# Patient Record
Sex: Female | Born: 1985 | Race: Black or African American | Hispanic: No | Marital: Single | Smoking: Former smoker
Health system: Southern US, Community
[De-identification: ages and names within clinical notes are randomized; demographics above are authoritative.]

## PROBLEM LIST (undated history)

## (undated) DIAGNOSIS — K219 Gastro-esophageal reflux disease without esophagitis: Secondary | ICD-10-CM

## (undated) DIAGNOSIS — I10 Essential (primary) hypertension: Secondary | ICD-10-CM

## (undated) DIAGNOSIS — C719 Malignant neoplasm of brain, unspecified: Secondary | ICD-10-CM

## (undated) DIAGNOSIS — K589 Irritable bowel syndrome without diarrhea: Secondary | ICD-10-CM

## (undated) DIAGNOSIS — C50919 Malignant neoplasm of unspecified site of unspecified female breast: Secondary | ICD-10-CM

## (undated) HISTORY — PX: MASTECTOMY: SHX3

## (undated) HISTORY — DX: Malignant neoplasm of brain, unspecified: C71.9

---
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
## 2007-01-01 ENCOUNTER — Other Ambulatory Visit: Admission: RE | Admit: 2007-01-01 | Discharge: 2007-01-01 | Payer: Self-pay | Admitting: Unknown Physician Specialty

## 2007-01-01 ENCOUNTER — Encounter (INDEPENDENT_AMBULATORY_CARE_PROVIDER_SITE_OTHER): Payer: Self-pay | Admitting: Unknown Physician Specialty

## 2008-03-05 ENCOUNTER — Emergency Department (HOSPITAL_COMMUNITY): Admission: EM | Admit: 2008-03-05 | Discharge: 2008-03-06 | Payer: Self-pay | Admitting: Emergency Medicine

## 2010-10-14 NOTE — Op Note (Signed)
April Moreno, April Moreno NO.:  [id number]   MEDICAL RECORD NO.:  [id number]          PATIENT TYPE:  OIB   LOCATION:  2899                         FACILITY:  [facility]   PHYSICIAN:  Jon Gills, M.D.  DATE OF BIRTH:  11/04/1985   DATE OF PROCEDURE:  06/17/2004  DATE OF DISCHARGE:  06/17/2004                                 OPERATIVE REPORT   PREOPERATIVE DIAGNOSES:  1.  Abdominal pain.  2.  Dark stools.   POSTOPERATIVE DIAGNOSES:  1.  Abdominal pain.  2.  Dark stools.   SURGEON:  Jon Gills, M.D.   ASSISTANT:  None.   DESCRIPTION OF FINDINGS:  Following informed written consent, the patient  was taken to the recovery room and placed under general anesthesia with  continuous cardiopulmonary monitoring.  She remained in the supine position  and the Olympus endoscope was inserted per mouth.  The esophagus, stomach  and duodenum were visualized.  No definite abnormalities were seen.  Specifically, there was no visual evidence for esophagitis, gastritis,  duodenitis or peptic ulcer disease.  A solitary gastric biopsy was obtained  and was negative for Helicobacter.  Several esophageal and duodenal biopsies  were histologically unremarkable.  [name] tolerated the procedure well and  was awakened and transferred to the recovery room in satisfactory condition.  She will be released later today to the care of her grandmother.   DESCRIPTION OF TECHNICAL PROCEDURES USED:  The Olympus GIA-140 endoscope  with cold biopsy forceps.   DESCRIPTION OF SPECIMENS REMOVED:  1.  Duodenum x 3.  2.  Gastric x 1.  3.  Esophagus x 2.      JHC/MEDQ  D:  06/23/2004  T:  06/23/2004  Job:  [id number]   cc:   Donna Bernard, M.D.  [address]  [place]  [place] [address]  Fax: [phone]

## 2011-02-27 LAB — URINALYSIS, ROUTINE W REFLEX MICROSCOPIC
Bilirubin Urine: NEGATIVE
Glucose, UA: NEGATIVE
Hgb urine dipstick: NEGATIVE
Specific Gravity, Urine: 1.032 — ABNORMAL HIGH
Urobilinogen, UA: 0.2

## 2011-02-27 LAB — RAPID URINE DRUG SCREEN, HOSP PERFORMED
Amphetamines: NOT DETECTED
Benzodiazepines: NOT DETECTED
Opiates: NOT DETECTED
Tetrahydrocannabinol: NOT DETECTED

## 2011-02-27 LAB — DIFFERENTIAL
Basophils Absolute: 0
Basophils Relative: 0
Lymphocytes Relative: 46
Monocytes Absolute: 0.4
Neutro Abs: 3.4
Neutrophils Relative %: 48

## 2011-02-27 LAB — POCT I-STAT, CHEM 8
BUN: 8
Chloride: 105
Creatinine, Ser: 0.8
Glucose, Bld: 78
Potassium: 3.7
Sodium: 139

## 2011-02-27 LAB — CBC
Hemoglobin: 13.4
Platelets: 316
RDW: 12.7

## 2011-02-27 LAB — HEPATIC FUNCTION PANEL
AST: 20
Albumin: 4
Total Protein: 7.6

## 2011-02-27 LAB — ETHANOL: Alcohol, Ethyl (B): <5

## 2013-05-22 ENCOUNTER — Encounter (HOSPITAL_COMMUNITY): Payer: Self-pay | Admitting: Emergency Medicine

## 2013-05-22 ENCOUNTER — Emergency Department (HOSPITAL_COMMUNITY)
Admission: EM | Admit: 2013-05-22 | Discharge: 2013-05-22 | Disposition: A | Discharge status: Home Health | Payer: BC Managed Care – PPO | Attending: Emergency Medicine | Admitting: Emergency Medicine

## 2013-05-22 DIAGNOSIS — R112 Nausea with vomiting, unspecified: Secondary | ICD-10-CM

## 2013-05-22 DIAGNOSIS — Z87440 Personal history of urinary (tract) infections: Secondary | ICD-10-CM | POA: Insufficient documentation

## 2013-05-22 DIAGNOSIS — Z79899 Other long term (current) drug therapy: Secondary | ICD-10-CM | POA: Insufficient documentation

## 2013-05-22 DIAGNOSIS — Z87442 Personal history of urinary calculi: Secondary | ICD-10-CM | POA: Insufficient documentation

## 2013-05-22 DIAGNOSIS — R109 Unspecified abdominal pain: Secondary | ICD-10-CM

## 2013-05-22 DIAGNOSIS — R1084 Generalized abdominal pain: Secondary | ICD-10-CM | POA: Insufficient documentation

## 2013-05-22 DIAGNOSIS — Z91040 Latex allergy status: Secondary | ICD-10-CM | POA: Insufficient documentation

## 2013-05-22 DIAGNOSIS — Z3202 Encounter for pregnancy test, result negative: Secondary | ICD-10-CM | POA: Insufficient documentation

## 2013-05-22 DIAGNOSIS — K589 Irritable bowel syndrome without diarrhea: Secondary | ICD-10-CM | POA: Insufficient documentation

## 2013-05-22 DIAGNOSIS — Z881 Allergy status to other antibiotic agents status: Secondary | ICD-10-CM | POA: Insufficient documentation

## 2013-05-22 DIAGNOSIS — I10 Essential (primary) hypertension: Secondary | ICD-10-CM | POA: Insufficient documentation

## 2013-05-22 DIAGNOSIS — K219 Gastro-esophageal reflux disease without esophagitis: Secondary | ICD-10-CM | POA: Insufficient documentation

## 2013-05-22 DIAGNOSIS — F17200 Nicotine dependence, unspecified, uncomplicated: Secondary | ICD-10-CM | POA: Insufficient documentation

## 2013-05-22 HISTORY — DX: Gastro-esophageal reflux disease without esophagitis: K21.9

## 2013-05-22 HISTORY — DX: Irritable bowel syndrome without diarrhea: K58.9

## 2013-05-22 HISTORY — DX: Essential (primary) hypertension: I10

## 2013-05-22 LAB — URINALYSIS, ROUTINE W REFLEX MICROSCOPIC
Bilirubin Urine: NEGATIVE
Nitrite: NEGATIVE
Specific Gravity, Urine: 1.018 (ref 1.005–1.030)
Urobilinogen, UA: 1 mg/dL (ref 0.0–1.0)
pH: 6.5 (ref 5.0–8.0)

## 2013-05-22 LAB — URINE MICROSCOPIC-ADD ON

## 2013-05-22 LAB — CBC WITH DIFFERENTIAL/PLATELET
Basophils Absolute: 0 10*3/uL (ref 0.0–0.1)
Eosinophils Relative: 0 % (ref 0–5)
HCT: 35.1 % — ABNORMAL LOW (ref 36.0–46.0)
Lymphocytes Relative: 18 % (ref 12–46)
Lymphs Abs: 2.1 10*3/uL (ref 0.7–4.0)
MCV: 87.1 fL (ref 78.0–100.0)
Neutro Abs: 8.3 10*3/uL — ABNORMAL HIGH (ref 1.7–7.7)
Platelets: 300 10*3/uL (ref 150–400)
RBC: 4.03 MIL/uL (ref 3.87–5.11)
RDW: 12 % (ref 11.5–15.5)
WBC: 11.6 10*3/uL — ABNORMAL HIGH (ref 4.0–10.5)

## 2013-05-22 LAB — BASIC METABOLIC PANEL
CO2: 24 mEq/L (ref 19–32)
Chloride: 97 mEq/L (ref 96–112)
Glucose, Bld: 126 mg/dL — ABNORMAL HIGH (ref 70–99)
Sodium: 135 mEq/L (ref 135–145)

## 2013-05-22 LAB — PREGNANCY, URINE: Preg Test, Ur: NEGATIVE

## 2013-05-22 MED ORDER — LORAZEPAM 2 MG/ML IJ SOLN
0.5000 mg | Freq: Once | INTRAMUSCULAR | Status: AC
  Administered 2013-05-22: 0.5 mg via INTRAVENOUS
  Filled 2013-05-22: qty 1

## 2013-05-22 MED ORDER — PROMETHAZINE HCL 25 MG PO TABS: 25.0000 mg | ORAL_TABLET | Freq: Four times a day (QID) | ORAL | Status: DC | PRN

## 2013-05-22 MED ORDER — DICYCLOMINE HCL 10 MG/ML IM SOLN
20.0000 mg | Freq: Once | INTRAMUSCULAR | Status: AC
  Administered 2013-05-22: 20 mg via INTRAMUSCULAR
  Filled 2013-05-22: qty 2

## 2013-05-22 MED ORDER — PROMETHAZINE HCL 25 MG/ML IJ SOLN
12.5000 mg | INTRAMUSCULAR | Status: AC
  Administered 2013-05-22: 12.5 mg via INTRAVENOUS
  Filled 2013-05-22: qty 1

## 2013-05-22 MED ORDER — HYDROMORPHONE HCL PF 1 MG/ML IJ SOLN
0.5000 mg | Freq: Once | INTRAMUSCULAR | Status: AC
  Administered 2013-05-22: 0.5 mg via INTRAVENOUS
  Filled 2013-05-22: qty 1

## 2013-05-22 MED ORDER — PROMETHAZINE HCL 25 MG RE SUPP: 25.0000 mg | Freq: Four times a day (QID) | RECTAL | Status: DC | PRN

## 2013-05-22 MED ORDER — HYDROCODONE-ACETAMINOPHEN 5-325 MG PO TABS: 2.0000 | ORAL_TABLET | ORAL | Status: DC | PRN

## 2013-05-22 NOTE — ED Notes (Signed)
Pt reports right flank pain, was seen in [place], [place] on Monday and told she has kidney stone and UTI. [facility] in [facility] because "the medicine wasn't working." Pt arrived here in [place] last night at 2000 and reports 'I keep vomiting and cant keep the pain medicine down." Denies blood in urine.

## 2013-05-22 NOTE — ED Notes (Signed)
Dr Otter at bedside  

## 2013-05-22 NOTE — ED Provider Notes (Signed)
CSN: [id number]     Arrival date & time 05/22/13  4098 History   First MD Initiated Contact with Patient 05/22/13 [phone]     Chief Complaint  Patient presents with  . Flank Pain   (Consider location/radiation/quality/duration/timing/severity/associated sxs/prior Treatment) HPI 27 yo female presents to the ER with complaint of diffuse abdominal pain, n/v.  Pt was dx with kidney stone on Monday in [place], also with UTI.  Pt has been unable to keep down medications.  No fevers, diarrhea.  No prior h/o kidney stones.  Pt with h/o IBS, has been unable to keep down her bentyl.   Past Medical History  Diagnosis Date  . IBS (irritable bowel syndrome)   . GERD (gastroesophageal reflux disease)   . Hypertension    History reviewed. No pertinent past surgical history. No family history on file. History  Substance Use Topics  . Smoking status: Current Every Day Smoker  . Smokeless tobacco: Not on file  . Alcohol Use: Yes   OB History   Grav Para Term Preterm Abortions TAB SAB Ect Mult Living                 Review of Systems  All other systems reviewed and are negative.    Allergies  Amoxicillin and Latex  Home Medications   Current Outpatient Rx  Name  Route  Sig  Dispense  Refill  . dicyclomine (BENTYL) 10 MG capsule   Oral   Take 10 mg by mouth 4 (four) times daily.         . ondansetron (ZOFRAN-ODT) 4 MG disintegrating tablet   Oral   Take 4 mg by mouth every 8 (eight) hours as needed for nausea or vomiting.         . sulfamethoxazole-trimethoprim (BACTRIM DS,SEPTRA DS) 800-160 MG per tablet   Oral   Take 1 tablet by mouth 2 (two) times daily.         . traMADol (ULTRAM) 50 MG tablet   Oral   Take 50 mg by mouth every 12 (twelve) hours as needed.         Marland Kitchen HYDROcodone-acetaminophen (NORCO/VICODIN) 5-325 MG per tablet   Oral   Take 2 tablets by mouth every 4 (four) hours as needed.   20 tablet   0   . promethazine (PHENERGAN) 25 MG suppository   Rectal  Place 1 suppository (25 mg total) rectally every 6 (six) hours as needed for nausea or vomiting.   12 each   0   . promethazine (PHENERGAN) 25 MG tablet   Oral   Take 1 tablet (25 mg total) by mouth every 6 (six) hours as needed for nausea.   30 tablet   0    BP 118/84  Pulse 71  Temp(Src) 99.4 F (37.4 C)  Resp 18  SpO2 98%  LMP 04/12/2013 Physical Exam  Constitutional: She is oriented to person, place, and time. She appears well-developed and well-nourished. She appears distressed (uncomfortable appearing).  HENT:  Head: Normocephalic and atraumatic.  Nose: Nose normal.  Mouth/Throat: Oropharynx is clear and moist.  Eyes: Conjunctivae and EOM are normal. Pupils are equal, round, and reactive to light.  Neck: Normal range of motion. Neck supple. No JVD present. No tracheal deviation present. No thyromegaly present.  Cardiovascular: Normal rate, regular rhythm, normal heart sounds and intact distal pulses.  Exam reveals no gallop and no friction rub.   No murmur heard. Pulmonary/Chest: Effort normal and breath sounds normal. No stridor. No respiratory  distress. She has no wheezes. She has no rales. She exhibits no tenderness.  Abdominal: Soft. Bowel sounds are normal. She exhibits no distension and no mass. There is no tenderness. There is no rebound and no guarding.  No cva tenderness, diffuse abd pain to palpation  Musculoskeletal: Normal range of motion. She exhibits no edema and no tenderness.  Lymphadenopathy:    She has no cervical adenopathy.  Neurological: She is oriented to person, place, and time. She has normal reflexes. No cranial nerve deficit. She exhibits normal muscle tone. Coordination normal.  Skin: Skin is dry. No rash noted. No erythema. No pallor.  Psychiatric: She has a normal mood and affect. Her behavior is normal. Judgment and thought content normal.    ED Course  Procedures (including critical care time) Labs Review Labs Reviewed  URINALYSIS,  ROUTINE W REFLEX MICROSCOPIC - Abnormal; Notable for the following:    APPearance CLOUDY (*)    Hgb urine dipstick SMALL (*)    Ketones, ur 15 (*)    Protein, ur 30 (*)    Leukocytes, UA TRACE (*)    All other components within normal limits  CBC WITH DIFFERENTIAL - Abnormal; Notable for the following:    WBC 11.6 (*)    HCT 35.1 (*)    Neutro Abs 8.3 (*)    Monocytes Absolute 1.2 (*)    All other components within normal limits  BASIC METABOLIC PANEL - Abnormal; Notable for the following:    Potassium 3.3 (*)    Glucose, Bld 126 (*)    GFR calc non Af Amer 71 (*)    GFR calc Af Amer 83 (*)    All other components within normal limits  URINE MICROSCOPIC-ADD ON - Abnormal; Notable for the following:    Squamous Epithelial / LPF MANY (*)    Bacteria, UA MANY (*)    All other components within normal limits  URINE CULTURE  PREGNANCY, URINE   Imaging Review No results found.    MDM   1. Abdominal pain   2. Nausea and vomiting    27 yo female with reported kidney stone, UTI, now with persistent n/v.  Labs here unremarkable.  Urine sent for culture.  Pt much improved with ivf, pain/nausea medications.  Records received from outside hospital, 4 mm stone mid right ureter.  Urine dip shows nitrites, le.  No micro of urine recorded.  Pt encouraged to start back on abx.  Strict return precautions given.    Olivia Mackie, MD 05/22/13 [phone]

## 2013-05-23 LAB — URINE CULTURE: Colony Count: 70000

## 2021-02-25 ENCOUNTER — Observation Stay (HOSPITAL_COMMUNITY): Payer: Commercial Managed Care - PPO

## 2021-02-25 ENCOUNTER — Encounter (HOSPITAL_COMMUNITY): Payer: Self-pay

## 2021-02-25 ENCOUNTER — Inpatient Hospital Stay (HOSPITAL_COMMUNITY)
Admission: EM | Admit: 2021-02-25 | Discharge: 2021-02-27 | DRG: 641 | Disposition: A | Discharge status: Home / Self Care | Payer: Commercial Managed Care - PPO | Attending: Family Medicine | Admitting: Family Medicine

## 2021-02-25 ENCOUNTER — Emergency Department (HOSPITAL_COMMUNITY): Payer: Commercial Managed Care - PPO

## 2021-02-25 DIAGNOSIS — K3184 Gastroparesis: Secondary | ICD-10-CM | POA: Diagnosis present

## 2021-02-25 DIAGNOSIS — Z8249 Family history of ischemic heart disease and other diseases of the circulatory system: Secondary | ICD-10-CM

## 2021-02-25 DIAGNOSIS — E860 Dehydration: Secondary | ICD-10-CM | POA: Diagnosis not present

## 2021-02-25 DIAGNOSIS — G893 Neoplasm related pain (acute) (chronic): Secondary | ICD-10-CM | POA: Diagnosis present

## 2021-02-25 DIAGNOSIS — Z79891 Long term (current) use of opiate analgesic: Secondary | ICD-10-CM

## 2021-02-25 DIAGNOSIS — Z20822 Contact with and (suspected) exposure to covid-19: Secondary | ICD-10-CM | POA: Diagnosis present

## 2021-02-25 DIAGNOSIS — Z79899 Other long term (current) drug therapy: Secondary | ICD-10-CM

## 2021-02-25 DIAGNOSIS — E872 Acidosis, unspecified: Secondary | ICD-10-CM | POA: Diagnosis present

## 2021-02-25 DIAGNOSIS — R531 Weakness: Secondary | ICD-10-CM

## 2021-02-25 DIAGNOSIS — R000 Tachycardia, unspecified: Secondary | ICD-10-CM | POA: Diagnosis present

## 2021-02-25 DIAGNOSIS — C50812 Malignant neoplasm of overlapping sites of left female breast: Secondary | ICD-10-CM | POA: Diagnosis present

## 2021-02-25 DIAGNOSIS — R112 Nausea with vomiting, unspecified: Secondary | ICD-10-CM

## 2021-02-25 DIAGNOSIS — Z9882 Breast implant status: Secondary | ICD-10-CM

## 2021-02-25 DIAGNOSIS — T451X5A Adverse effect of antineoplastic and immunosuppressive drugs, initial encounter: Secondary | ICD-10-CM | POA: Diagnosis present

## 2021-02-25 DIAGNOSIS — R350 Frequency of micturition: Secondary | ICD-10-CM | POA: Diagnosis present

## 2021-02-25 HISTORY — DX: Malignant neoplasm of unspecified site of unspecified female breast: C50.919

## 2021-02-25 LAB — CBC WITH DIFFERENTIAL/PLATELET
Abs Immature Granulocytes: 0.01 10*3/uL (ref 0.00–0.07)
Basophils Absolute: 0 10*3/uL (ref 0.0–0.1)
Basophils Relative: 0 %
Eosinophils Absolute: 0.1 10*3/uL (ref 0.0–0.5)
Eosinophils Relative: 1 %
HCT: 37.7 % (ref 36.0–46.0)
Hemoglobin: 12.4 g/dL (ref 12.0–15.0)
Immature Granulocytes: 0 %
Lymphocytes Relative: 45 %
Lymphs Abs: 2.7 10*3/uL (ref 0.7–4.0)
MCH: 31 pg (ref 26.0–34.0)
MCHC: 32.9 g/dL (ref 30.0–36.0)
MCV: 94.3 fL (ref 80.0–100.0)
Monocytes Absolute: 0.1 10*3/uL (ref 0.1–1.0)
Monocytes Relative: 1 %
Neutro Abs: 3.2 10*3/uL (ref 1.7–7.7)
Neutrophils Relative %: 53 %
Platelets: 419 10*3/uL — ABNORMAL HIGH (ref 150–400)
RBC: 4 MIL/uL (ref 3.87–5.11)
RDW: 11.8 % (ref 11.5–15.5)
WBC: 6.1 10*3/uL (ref 4.0–10.5)
nRBC: 0 % (ref 0.0–0.2)

## 2021-02-25 LAB — URINALYSIS, ROUTINE W REFLEX MICROSCOPIC
Bilirubin Urine: NEGATIVE
Glucose, UA: NEGATIVE mg/dL
Hgb urine dipstick: NEGATIVE
Ketones, ur: NEGATIVE mg/dL
Leukocytes,Ua: NEGATIVE
Nitrite: NEGATIVE
Protein, ur: NEGATIVE mg/dL
Specific Gravity, Urine: 1.01 (ref 1.005–1.030)
pH: 7 (ref 5.0–8.0)

## 2021-02-25 LAB — PROTIME-INR
INR: 1 (ref 0.8–1.2)
Prothrombin Time: 13.3 seconds (ref 11.4–15.2)

## 2021-02-25 LAB — COMPREHENSIVE METABOLIC PANEL
ALT: 36 U/L (ref 0–44)
AST: 31 U/L (ref 15–41)
Albumin: 4.1 g/dL (ref 3.5–5.0)
Alkaline Phosphatase: 103 U/L (ref 38–126)
Anion gap: 10 (ref 5–15)
BUN: 8 mg/dL (ref 6–20)
CO2: 26 mmol/L (ref 22–32)
Calcium: 9.1 mg/dL (ref 8.9–10.3)
Chloride: 99 mmol/L (ref 98–111)
Creatinine, Ser: 0.52 mg/dL (ref 0.44–1.00)
GFR, Estimated: >60 mL/min (ref >60)
Glucose, Bld: 90 mg/dL (ref 70–99)
Potassium: 3.7 mmol/L (ref 3.5–5.1)
Sodium: 135 mmol/L (ref 135–145)
Total Bilirubin: 0.9 mg/dL (ref 0.3–1.2)
Total Protein: 7.8 g/dL (ref 6.5–8.1)

## 2021-02-25 LAB — RESP PANEL BY RT-PCR (FLU A&B, COVID) ARPGX2
Influenza A by PCR: NEGATIVE
Influenza B by PCR: NEGATIVE
SARS Coronavirus 2 by RT PCR: NEGATIVE

## 2021-02-25 LAB — APTT
aPTT: >200 seconds (ref 24–36)
aPTT: 25 seconds (ref 24–36)

## 2021-02-25 LAB — LACTIC ACID, PLASMA
Lactic Acid, Venous: 1.4 mmol/L (ref 0.5–1.9)
Lactic Acid, Venous: 2.4 mmol/L (ref 0.5–1.9)
Lactic Acid, Venous: 1.5 mmol/L (ref 0.5–1.9)

## 2021-02-25 LAB — PROCALCITONIN: Procalcitonin: <0.1 ng/mL

## 2021-02-25 MED ORDER — FENTANYL CITRATE PF 50 MCG/ML IJ SOSY
12.5000 ug | PREFILLED_SYRINGE | INTRAMUSCULAR | Status: DC | PRN
  Administered 2021-02-25 – 2021-02-27 (×8): 50 ug via INTRAVENOUS
  Filled 2021-02-25 (×8): qty 1

## 2021-02-25 MED ORDER — IOHEXOL 350 MG/ML SOLN
75.0000 mL | Freq: Once | INTRAVENOUS | Status: AC | PRN
  Administered 2021-02-25: 75 mL via INTRAVENOUS

## 2021-02-25 MED ORDER — FENTANYL CITRATE PF 50 MCG/ML IJ SOSY
50.0000 ug | PREFILLED_SYRINGE | Freq: Once | INTRAMUSCULAR | Status: AC
Start: 2021-02-25 — End: 2021-02-25
  Administered 2021-02-25: 50 ug via INTRAVENOUS
  Filled 2021-02-25: qty 1

## 2021-02-25 MED ORDER — ONDANSETRON HCL 4 MG/2ML IJ SOLN
4.0000 mg | Freq: Once | INTRAMUSCULAR | Status: AC
  Administered 2021-02-25: 4 mg via INTRAVENOUS
  Filled 2021-02-25: qty 2

## 2021-02-25 MED ORDER — FENTANYL CITRATE PF 50 MCG/ML IJ SOSY
50.0000 ug | PREFILLED_SYRINGE | Freq: Once | INTRAMUSCULAR | Status: AC
  Administered 2021-02-25: 50 ug via INTRAVENOUS
  Filled 2021-02-25: qty 1

## 2021-02-25 MED ORDER — SODIUM CHLORIDE 0.9 % IV BOLUS
1000.0000 mL | Freq: Once | INTRAVENOUS | Status: AC
  Administered 2021-02-25: 1000 mL via INTRAVENOUS

## 2021-02-25 MED ORDER — SODIUM CHLORIDE 0.9 % IV SOLN
12.5000 mg | Freq: Four times a day (QID) | INTRAVENOUS | Status: DC | PRN
  Administered 2021-02-26 – 2021-02-27 (×4): 12.5 mg via INTRAVENOUS
  Filled 2021-02-25 (×5): qty 0.5

## 2021-02-25 MED ORDER — SODIUM CHLORIDE 0.9 % IV BOLUS
1000.0000 mL | Freq: Once | INTRAVENOUS | Status: AC
Start: 2021-02-25 — End: 2021-02-25
  Administered 2021-02-25: 1000 mL via INTRAVENOUS

## 2021-02-25 MED ORDER — METOCLOPRAMIDE HCL 5 MG/ML IJ SOLN
10.0000 mg | Freq: Once | INTRAMUSCULAR | Status: AC
  Administered 2021-02-25: 10 mg via INTRAVENOUS
  Filled 2021-02-25: qty 2

## 2021-02-25 MED ORDER — PROMETHAZINE HCL 25 MG/ML IJ SOLN
INTRAMUSCULAR | Status: AC
  Filled 2021-02-25: qty 1

## 2021-02-25 NOTE — ED Notes (Signed)
Date and time results received: 02/25/21 6:08 PM  (use smartphrase ".now" to insert current time)  Test: Lactic Acid Critical Value: 2.4  Name of Provider Notified: Abby, PA  Orders Received? Or Actions Taken?: See chart

## 2021-02-25 NOTE — ED Triage Notes (Signed)
Pt. Arrived POV. Pt. Is feeling really weak from chemotherapy. Pt. States their left breast is hurting. Pt. Has breast cancer. Pt. Had chemotherapy on Tuesday.

## 2021-02-25 NOTE — ED Provider Notes (Signed)
[facility] EMERGENCY DEPARTMENT Provider Note   CSN: [id number] Arrival date & time: 02/25/21  1351     History No chief complaint on file.   April Moreno is an unfortunate 35 y.o. female with a pmh of recurrent metastatic breast cancer who presents emergency department with a chief complaint of weakness.  Patient reports that 1 year ago she was diagnosed with breast cancer. She had a BL mastectomy and Breast revision surgery. She had an area of the scar on her left breast that was nonhealing.  Patient states that she recently underwent a biopsy of that nonhearing area where they found out she had recurrent cancer.  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  Patient had her stitches removed recently and states that it is extremely sore in that area.  She had her first round of chemotherapy this past Monday.  She states that she has been getting progressively weaker since that time.  She states "I never felt this bad before in the past when I had chemotherapy."  She reports being too weak to take pain medication for her left breast pain.  She has had very poor oral intake and has been very nauseated at home.  She denies cough, chills, fever, shortness of breath, unilateral leg swelling, abdominal pain, vomiting or diarrhea, urinary symptoms.  HPI     Past Medical History:  Diagnosis Date   Breast cancer ([place])    GERD (gastroesophageal reflux disease)    Hypertension    IBS (irritable bowel syndrome)     There are no problems to display for this patient.   Past Surgical History:  Procedure Laterality Date   MASTECTOMY       OB History   No obstetric history on file.     History reviewed. No pertinent family history.  Social History   Tobacco Use   Smoking status: Every Day   Smokeless tobacco: Never  Vaping Use   Vaping Use: Every day   Substances: THC, CBD  Substance Use Topics   Alcohol use: Yes   Drug use: No    Home Medications Prior to Admission  medications   Medication Sig Start Date End Date Taking? Authorizing Provider  dicyclomine (BENTYL) 10 MG capsule Take 10 mg by mouth 4 (four) times daily.    [provider]  HYDROcodone-acetaminophen (NORCO/VICODIN) 5-325 MG per tablet Take 2 tablets by mouth every 4 (four) hours as needed. 05/22/13   Linton Flemings, MD  ondansetron (ZOFRAN-ODT) 4 MG disintegrating tablet Take 4 mg by mouth every 8 (eight) hours as needed for nausea or vomiting.    [provider]  promethazine (PHENERGAN) 25 MG suppository Place 1 suppository (25 mg total) rectally every 6 (six) hours as needed for nausea or vomiting. 05/22/13   Linton Flemings, MD  promethazine (PHENERGAN) 25 MG tablet Take 1 tablet (25 mg total) by mouth every 6 (six) hours as needed for nausea. 05/22/13   Linton Flemings, MD  sulfamethoxazole-trimethoprim (BACTRIM DS,SEPTRA DS) 800-160 MG per tablet Take 1 tablet by mouth 2 (two) times daily.    [provider]  traMADol (ULTRAM) 50 MG tablet Take 50 mg by mouth every 12 (twelve) hours as needed.    [provider]    Allergies    Amoxicillin and Latex  Review of Systems   Review of Systems  Constitutional:  Positive for activity change, appetite change and fatigue. Negative for chills, diaphoresis and fever.  HENT: Negative.  Eyes: Negative.   Respiratory: Negative.    Cardiovascular: Negative.   Gastrointestinal:  Positive for nausea. Negative for diarrhea and vomiting.  Genitourinary: Negative.   Musculoskeletal: Negative.   Skin:  Positive for wound.  Neurological: Negative.   Psychiatric/Behavioral:  Negative for confusion.    Physical Exam Updated Vital Signs BP 110/82 (BP Location: Right Arm)   Pulse (!) 117   Temp 98.3 F (36.8 C) (Oral)   Resp 18   Ht 5' (1.524 m)   Wt 77.1 kg   LMP 02/22/2021 (Exact Date)   SpO2 100%   BMI 33.20 kg/m   Physical Exam Vitals and nursing note reviewed.  Constitutional:      General: She is not in  acute distress.    Appearance: She is well-developed. She is ill-appearing. She is not diaphoretic.  HENT:     Head: Normocephalic and atraumatic.     Right Ear: External ear normal.     Left Ear: External ear normal.     Nose: Nose normal.     Mouth/Throat:     Mouth: Mucous membranes are dry.  Eyes:     General: No scleral icterus.    Extraocular Movements: Extraocular movements intact.     Conjunctiva/sclera: Conjunctivae normal.     Pupils: Pupils are equal, round, and reactive to light.  Cardiovascular:     Rate and Rhythm: Normal rate and regular rhythm.     Heart sounds: Normal heart sounds. No murmur heard.   No friction rub. No gallop.  Pulmonary:     Effort: Pulmonary effort is normal. No respiratory distress.     Breath sounds: Normal breath sounds.  Chest:       Comments: BL surgical scars present on breasts Fungating lesion over the lateral left scar. Exquisitely tender/ No obvious necrosis or cellulitis. Abdominal:     General: Bowel sounds are normal. There is no distension.     Palpations: Abdomen is soft. There is no mass.     Tenderness: There is no abdominal tenderness. There is no guarding.  Musculoskeletal:     Cervical back: Normal range of motion.  Skin:    General: Skin is warm and dry.  Neurological:     Mental Status: She is alert and oriented to person, place, and time.  Psychiatric:        Behavior: Behavior normal.    ED Results / Procedures / Treatments   Labs (all labs ordered are listed, but only abnormal results are displayed) Labs Reviewed  CULTURE, BLOOD (ROUTINE X 2)  CULTURE, BLOOD (ROUTINE X 2)  LACTIC ACID, PLASMA  LACTIC ACID, PLASMA  COMPREHENSIVE METABOLIC PANEL  CBC WITH DIFFERENTIAL/PLATELET  PROTIME-INR  APTT  URINALYSIS, ROUTINE W REFLEX MICROSCOPIC  POC URINE PREG, ED    EKG None  Radiology No results found.  Procedures Procedures   Medications Ordered in ED Medications  sodium chloride 0.9 % bolus 1,000  mL (has no administration in time range)    ED Course  I have reviewed the triage vital signs and the nursing notes.  Pertinent labs & imaging results that were available during my care of the patient were reviewed by me and considered in my medical decision making (see chart for details).    MDM Rules/Calculators/A&P                           35 year old female who presents emergency department with chief complaint of fatigue and  weakness. The differential diagnosis of weakness includes but is not limited to neurologic causes (GBS, myasthenia gravis, CVA, MS, ALS, transverse myelitis, spinal cord injury, CVA, botulism, ) and other causes: ACS, Arrhythmia, syncope, orthostatic hypotension, sepsis, hypoglycemia, electrolyte disturbance, hypothyroidism, respiratory failure, symptomatic anemia, dehydration, heat injury, polypharmacy, malignancy. I ordered and reviewed labs that included CBC without with elevated white blood cell count or leukopenia, urinalysis within normal limits, CMP within normal limits, negative respiratory panel.  Patient's lactic acid elevated at 2.4 likely due to dehydration. Patient given 2 L of fluid but remained persistently mildly tachycardic.  She is otherwise normotensive. I discussed the case with Dr.Akinleye of oncology service at [facility] in [place] [employer] who recommends admission and procalcitonin level.  He states that if she is feeling better tomorrow she may be discharged.  I discussed the patient with Dr. Clearence Ped who will admit the patient for observation. Final Clinical Impression(s) / ED Diagnoses Final diagnoses:  None    Rx / DC Orders ED Discharge Orders     None        Margarita Mail, PA-C 02/25/21 2048    Isla Pence, MD 02/28/21 [phone]

## 2021-02-25 NOTE — H&P (Signed)
TRH H&P    Patient Demographics:    April Moreno, is a 35 y.o. female  MRN: [id number]  DOB - 1985-12-08  Admit Date - 02/25/2021  Referring MD/NP/PA: Vernie Shanks  Outpatient Primary MD for the patient is Pcp, No  Patient coming from: Home  Chief complaint- "Bad reaction to chemo"   HPI:    April Moreno  is a 35 y.o. female, with history of breast cancer, GERD, hypertension, IBS, presents the ED with a chief complaint of bad reaction to chemo.  Patient reports that she has had extreme generalized weakness starting yesterday.  She reports that her last chemo was 2 days prior to that.  That was actually the first chemo of this course for her recurrent breast cancer.  She is not had any radiation yet.  She follows with [facility] for oncology.  Patient reports that she was so weak she was having difficulty even holding her head up.  She reports that she is normally able to ambulate independently, but has been having to hold onto furniture and walls to ambulate the past 2 days.  Her last normal meal was 3 days ago.  She has had nausea and vomiting approximately 2 times a day.  Its nonbloody.  She has had loose stools approximately 2 times a day are also nonbloody.  She reports increase in urine frequency, but no dysuria, flank pain, hematuria.  Patient denies cough but she admits to dyspnea even at rest.  She admits to palpitations that are both from anxiety, and sometimes associated with ambulation.  Patient denies any fever or chills.  Patient takes Reglan for gastroparesis.  It helps with her nausea.  Zofran does not help with her nausea at all.  Patient has been taking THC edibles her last 1 was 2 days ago.  Patient does not drink, does not smoke cigarettes, does not use other illicit drugs.  Patient is vaccinated for COVID.  Patient reports this is worse than she has ever felt with chemo before. Patient has no other complaints at  this time.  In the ED Temp 98.3, heart rate 103-117, respiratory rate 12-20, blood pressure 112/82, satting 100% Lactic acid initially 1.4 and then 2.4 and then trending back down to 1.0. No leukocytosis, hemoglobin 12.4 Thrombocytosis 419 Chemistry panel is unremarkable APTT initially greater than 200, repeat normal at 25 Chest x-ray shows no active disease Fentanyl given for chest wall pain, Reglan, Zofran, and 2 L normal saline bolus given in the ED.     Review of systems:    In addition to the HPI above,  No Fever-chills, No Headache, No changes with Vision or hearing, No problems swallowing food or Liquids, Chest wall pain from biopsy, no cough admits to shortness of Breath, No Abdominal pain, admits to nausea, vomiting, loose stools No Blood in stool or Urine, No dysuria, No new skin rashes or bruises, No new joints pains-aches,  No new weakness, tingling, numbness in any extremity, No recent weight gain or loss, No polyuria, polydypsia or polyphagia, No significant Mental Stressors.  All other systems reviewed and are negative.    Past History of the following :    Past Medical History:  Diagnosis Date   Breast cancer ([place])    GERD (gastroesophageal reflux disease)    Hypertension    IBS (irritable bowel syndrome)       Past Surgical History:  Procedure Laterality Date   MASTECTOMY        Social History:      Social History   Tobacco Use   Smoking status: Every Day   Smokeless tobacco: Never  Substance Use Topics   Alcohol use: Yes       Family History :    History reviewed. No pertinent family history. Family history hypertension   Home Medications:   Prior to Admission medications   Medication Sig Start Date End Date Taking? Authorizing Provider  amitriptyline (ELAVIL) 100 MG tablet Take 100 mg by mouth at bedtime.   Yes [provider]  metoCLOPramide (REGLAN) 10 MG tablet Take 10 mg by mouth 2 (two) times daily.   Yes  [provider]  oxyCODONE-acetaminophen (PERCOCET/ROXICET) 5-325 MG tablet Take 1 tablet by mouth every other day.   Yes [provider]  traMADol (ULTRAM) 50 MG tablet Take 50 mg by mouth every 12 (twelve) hours as needed.   Yes [provider]  dicyclomine (BENTYL) 10 MG capsule Take 10 mg by mouth 4 (four) times daily. Patient not taking: Reported on 02/25/2021    [provider]  HYDROcodone-acetaminophen (NORCO/VICODIN) 5-325 MG per tablet Take 2 tablets by mouth every 4 (four) hours as needed. Patient not taking: No sig reported 05/22/13   Linton Flemings, MD  ondansetron (ZOFRAN-ODT) 4 MG disintegrating tablet Take 4 mg by mouth every 8 (eight) hours as needed for nausea or vomiting. Patient not taking: Reported on 02/25/2021    [provider]  promethazine (PHENERGAN) 25 MG suppository Place 1 suppository (25 mg total) rectally every 6 (six) hours as needed for nausea or vomiting. Patient not taking: No sig reported 05/22/13   Linton Flemings, MD  promethazine (PHENERGAN) 25 MG tablet Take 1 tablet (25 mg total) by mouth every 6 (six) hours as needed for nausea. Patient not taking: No sig reported 05/22/13   Linton Flemings, MD  sulfamethoxazole-trimethoprim (BACTRIM DS,SEPTRA DS) 800-160 MG per tablet Take 1 tablet by mouth 2 (two) times daily. Patient not taking: Reported on 02/25/2021    [provider]     Allergies:     Allergies  Allergen Reactions   Amoxicillin    Latex      Physical Exam:   Vitals  Blood pressure (!) 133/94, pulse 96, temperature 98.3 F (36.8 C), temperature source Oral, resp. rate 16, height 5' (1.524 m), weight 77.1 kg, last menstrual period 02/22/2021, SpO2 100 %.  1.  General: Patient lying supine in bed,  no acute distress   2. Psychiatric: Alert and oriented x 3, mood and behavior normal for situation, pleasant and cooperative with exam   3. Neurologic: Speech and language are normal, face is  symmetric, moves all 4 extremities voluntarily, at baseline without acute deficits on limited exam   4. HEENMT:  Head is atraumatic, normocephalic, pupils reactive to light, neck is supple, trachea is midline, mucous membranes are moist   5. Respiratory : Lungs are clear to auscultation bilaterally without wheezing, rhonchi, rales, no cyanosis, no increase in work of breathing or accessory muscle use   6. Cardiovascular : Heart rate tachycardic, rhythm  is regular, no murmurs, rubs or gallops, no peripheral edema, peripheral pulses palpated   7. Gastrointestinal:  Abdomen is soft, nondistended, nontender to palpation bowel sounds active, no masses or organomegaly palpated   8. Skin:  Skin is warm, dry and intact without rashes, acute lesions, or ulcers on limited exam   9.Musculoskeletal:  No acute deformities or trauma, no asymmetry in tone, no peripheral edema, peripheral pulses palpated, there is chest wall tenderness over bandage area from previous biopsy    Data Review:    CBC Recent Labs  Lab 02/25/21 1513  WBC 6.1  HGB 12.4  HCT 37.7  PLT 419*  MCV 94.3  MCH 31.0  MCHC 32.9  RDW 11.8  LYMPHSABS 2.7  MONOABS 0.1  EOSABS 0.1  BASOSABS 0.0   ------------------------------------------------------------------------------------------------------------------  Results for orders placed or performed during the hospital encounter of 02/25/21 (from the past 48 hour(s))  Urinalysis, Routine w reflex microscopic     Status: None   Collection Time: 02/25/21  2:21 PM  Result Value Ref Range   Color, Urine YELLOW YELLOW   APPearance CLEAR CLEAR   Specific Gravity, Urine 1.010 1.005 - 1.030   pH 7.0 5.0 - 8.0   Glucose, UA NEGATIVE NEGATIVE mg/dL   Hgb urine dipstick NEGATIVE NEGATIVE   Bilirubin Urine NEGATIVE NEGATIVE   Ketones, ur NEGATIVE NEGATIVE mg/dL   Protein, ur NEGATIVE NEGATIVE mg/dL   Nitrite NEGATIVE NEGATIVE   Leukocytes,Ua NEGATIVE NEGATIVE    Comment:  Performed at [facility], [address], [place], [place] [address]  Comprehensive metabolic panel     Status: None   Collection Time: 02/25/21  3:13 PM  Result Value Ref Range   Sodium 135 135 - 145 mmol/L   Potassium 3.7 3.5 - 5.1 mmol/L   Chloride 99 98 - 111 mmol/L   CO2 26 22 - 32 mmol/L   Glucose, Bld 90 70 - 99 mg/dL    Comment: Glucose reference range applies only to samples taken after fasting for at least 8 hours.   BUN 8 6 - 20 mg/dL   Creatinine, Ser 0.52 0.44 - 1.00 mg/dL   Calcium 9.1 8.9 - 10.3 mg/dL   Total Protein 7.8 6.5 - 8.1 g/dL   Albumin 4.1 3.5 - 5.0 g/dL   AST 31 15 - 41 U/L   ALT 36 0 - 44 U/L   Alkaline Phosphatase 103 38 - 126 U/L   Total Bilirubin 0.9 0.3 - 1.2 mg/dL   GFR, Estimated >60 >60 mL/min    Comment: (NOTE) Calculated using the CKD-EPI Creatinine Equation (2021)    Anion gap 10 5 - 15    Comment: Performed at [facility], [address] [place],  [address]  CBC WITH DIFFERENTIAL     Status: Abnormal   Collection Time: 02/25/21  3:13 PM  Result Value Ref Range   WBC 6.1 4.0 - 10.5 K/uL   RBC 4.00 3.87 - 5.11 MIL/uL   Hemoglobin 12.4 12.0 - 15.0 g/dL   HCT 37.7 36.0 - 46.0 %   MCV 94.3 80.0 - 100.0 fL   MCH 31.0 26.0 - 34.0 pg   MCHC 32.9 30.0 - 36.0 g/dL   RDW 11.8 11.5 - 15.5 %   Platelets 419 (H) 150 - 400 K/uL   nRBC 0.0 0.0 - 0.2 %   Neutrophils Relative % 53 %   Neutro Abs 3.2 1.7 - 7.7 K/uL   Lymphocytes Relative 45 %   Lymphs Abs 2.7  0.7 - 4.0 K/uL   Monocytes Relative 1 %   Monocytes Absolute 0.1 0.1 - 1.0 K/uL   Eosinophils Relative 1 %   Eosinophils Absolute 0.1 0.0 - 0.5 K/uL   Basophils Relative 0 %   Basophils Absolute 0.0 0.0 - 0.1 K/uL   Immature Granulocytes 0 %   Abs Immature Granulocytes 0.01 0.00 - 0.07 K/uL    Comment: Performed at [facility], [address], [place], [place] [address]  Protime-INR     Status: None   Collection Time: 02/25/21  3:13 PM  Result Value Ref Range   Prothrombin Time  13.3 11.4 - 15.2 seconds   INR 1.0 0.8 - 1.2    Comment: (NOTE) INR goal varies based on device and disease states. Performed at [facility], [address], [place], [place] [address]   APTT     Status: Abnormal   Collection Time: 02/25/21  3:13 PM  Result Value Ref Range   aPTT >200 (HH) 24 - 36 seconds    Comment:        IF BASELINE aPTT IS ELEVATED, SUGGEST PATIENT RISK ASSESSMENT BE USED TO DETERMINE APPROPRIATE ANTICOAGULANT THERAPY. REPEATED TO VERIFY CRITICAL RESULT CALLED TO, READ BACK BY AND VERIFIED WITH: DAVIS N @ 1648 ON [id number] BY HENDERSON L. Performed at [facility], [address] [place], [place] [address]   Lactic acid, plasma     Status: None   Collection Time: 02/25/21  3:19 PM  Result Value Ref Range   Lactic Acid, Venous 1.4 0.5 - 1.9 mmol/L    Comment: Performed at [facility], [address] [place], [place] [address]  Blood Culture (routine x 2)     Status: None (Preliminary result)   Collection Time: 02/25/21  3:19 PM   Specimen: Porta Cath; Blood  Result Value Ref Range   Specimen Description PORTA CATH    Special Requests      Blood Culture adequate volume BOTTLES DRAWN AEROBIC AND ANAEROBIC   Culture      NO GROWTH < 24 HOURS Performed at [facility], [address] [place], [place] [address]    Report Status PENDING   Resp Panel by RT-PCR (Flu A&B, Covid) Nasopharyngeal Swab     Status: None   Collection Time: 02/25/21  3:33 PM   Specimen: Nasopharyngeal Swab; Nasopharyngeal(NP) swabs in vial transport medium  Result Value Ref Range   SARS Coronavirus 2 by RT PCR NEGATIVE NEGATIVE    Comment: (NOTE) SARS-CoV-2 target nucleic acids are NOT DETECTED.  The SARS-CoV-2 RNA is generally detectable in upper respiratory specimens during the acute phase of infection. The lowest concentration of SARS-CoV-2 viral copies this assay can detect is 138 copies/mL. A negative result does not preclude SARS-Cov-2 infection and should not be used as the  sole basis for treatment or other patient management decisions. A negative result may occur with  improper specimen collection/handling, submission of specimen other than nasopharyngeal swab, presence of viral mutation(s) within the areas targeted by this assay, and inadequate number of viral copies(<138 copies/mL). A negative result must be combined with clinical observations, patient history, and epidemiological information. The expected result is Negative.  Fact Sheet for Patients:  EntrepreneurPulse.com.au  Fact Sheet for Healthcare Providers:  IncredibleEmployment.be  This test is no t yet approved or cleared by the [place] FDA and  has been authorized for detection and/or diagnosis of SARS-CoV-2 by FDA under an Emergency Use Authorization (EUA). This EUA will remain  in effect (  meaning this test can be used) for the duration of the COVID-19 declaration under Section 564(b)(1) of the Act, 21 U.S.C.section 360bbb-3(b)(1), unless the authorization is terminated  or revoked sooner.       Influenza A by PCR NEGATIVE NEGATIVE   Influenza B by PCR NEGATIVE NEGATIVE    Comment: (NOTE) The Xpert Xpress SARS-CoV-2/FLU/RSV plus assay is intended as an aid in the diagnosis of influenza from Nasopharyngeal swab specimens and should not be used as a sole basis for treatment. Nasal washings and aspirates are unacceptable for Xpert Xpress SARS-CoV-2/FLU/RSV testing.  Fact Sheet for Patients: EntrepreneurPulse.com.au  Fact Sheet for Healthcare Providers: IncredibleEmployment.be  This test is not yet approved or cleared by the [place] FDA and has been authorized for detection and/or diagnosis of SARS-CoV-2 by FDA under an Emergency Use Authorization (EUA). This EUA will remain in effect (meaning this test can be used) for the duration of the COVID-19 declaration under Section 564(b)(1) of the Act, 21  U.S.C. section 360bbb-3(b)(1), unless the authorization is terminated or revoked.  Performed at [facility], [address] [place], [place] [address]   APTT     Status: None   Collection Time: 02/25/21  4:59 PM  Result Value Ref Range   aPTT 25 24 - 36 seconds    Comment: Performed at [facility], [address], [place], [place] [address]  Lactic acid, plasma     Status: Abnormal   Collection Time: 02/25/21  5:19 PM  Result Value Ref Range   Lactic Acid, Venous 2.4 (HH) 0.5 - 1.9 mmol/L    Comment: CRITICAL RESULT CALLED TO, READ BACK BY AND VERIFIED WITH: DOSS,M ON 02/25/21 AT 1805 BY LOY,C Performed at [facility], [address], [place], [place] [address]   Blood Culture (routine x 2)     Status: None (Preliminary result)   Collection Time: 02/25/21  5:19 PM   Specimen: BLOOD RIGHT HAND  Result Value Ref Range   Specimen Description BLOOD RIGHT HAND    Special Requests      Blood Culture adequate volume BOTTLES DRAWN AEROBIC AND ANAEROBIC   Culture      NO GROWTH < 12 HOURS Performed at [facility], [address] [place], [place] [address]    Report Status PENDING   Procalcitonin - Baseline     Status: None   Collection Time: 02/25/21  9:29 PM  Result Value Ref Range   Procalcitonin <0.10 ng/mL    Comment:        Interpretation: PCT (Procalcitonin) <= 0.5 ng/mL: Systemic infection (sepsis) is not likely. Local bacterial infection is possible. (NOTE)       Sepsis PCT Algorithm           Lower Respiratory Tract                                      Infection PCT Algorithm    ----------------------------     ----------------------------         PCT < 0.25 ng/mL                PCT < 0.10 ng/mL          Strongly encourage             Strongly discourage   discontinuation of antibiotics    initiation of antibiotics    ----------------------------     -----------------------------  PCT 0.25 - 0.50 ng/mL            PCT 0.10 - 0.25 ng/mL               OR        >80% decrease in PCT            Discourage initiation of                                            antibiotics      Encourage discontinuation           of antibiotics    ----------------------------     -----------------------------         PCT >= 0.50 ng/mL              PCT 0.26 - 0.50 ng/mL               AND        <80% decrease in PCT             Encourage initiation of                                             antibiotics       Encourage continuation           of antibiotics    ----------------------------     -----------------------------        PCT >= 0.50 ng/mL                  PCT > 0.50 ng/mL               AND         increase in PCT                  Strongly encourage                                      initiation of antibiotics    Strongly encourage escalation           of antibiotics                                     -----------------------------                                           PCT <= 0.25 ng/mL                                                 OR                                        > 80% decrease in PCT  Discontinue / Do not initiate                                             antibiotics  Performed at [facility], [address] [place], [place] [address]   Lactic acid, plasma     Status: None   Collection Time: 02/25/21  9:29 PM  Result Value Ref Range   Lactic Acid, Venous 1.5 0.5 - 1.9 mmol/L    Comment: Performed at [facility], [address], [place], [place] [address]  Procalcitonin     Status: None   Collection Time: 02/25/21 11:58 PM  Result Value Ref Range   Procalcitonin <0.10 ng/mL    Comment:        Interpretation: PCT (Procalcitonin) <= 0.5 ng/mL: Systemic infection (sepsis) is not likely. Local bacterial infection is possible. (NOTE)       Sepsis PCT Algorithm           Lower Respiratory Tract                                      Infection PCT Algorithm    ----------------------------      ----------------------------         PCT < 0.25 ng/mL                PCT < 0.10 ng/mL          Strongly encourage             Strongly discourage   discontinuation of antibiotics    initiation of antibiotics    ----------------------------     -----------------------------       PCT 0.25 - 0.50 ng/mL            PCT 0.10 - 0.25 ng/mL               OR       >80% decrease in PCT            Discourage initiation of                                            antibiotics      Encourage discontinuation           of antibiotics    ----------------------------     -----------------------------         PCT >= 0.50 ng/mL              PCT 0.26 - 0.50 ng/mL               AND        <80% decrease in PCT             Encourage initiation of                                             antibiotics       Encourage continuation           of antibiotics    ----------------------------     -----------------------------  PCT >= 0.50 ng/mL                  PCT > 0.50 ng/mL               AND         increase in PCT                  Strongly encourage                                      initiation of antibiotics    Strongly encourage escalation           of antibiotics                                     -----------------------------                                           PCT <= 0.25 ng/mL                                                 OR                                        > 80% decrease in PCT                                      Discontinue / Do not initiate                                             antibiotics  Performed at [facility], [address], [place], [place] [address]   Lactic acid, plasma     Status: None   Collection Time: 02/25/21 11:59 PM  Result Value Ref Range   Lactic Acid, Venous 1.0 0.5 - 1.9 mmol/L    Comment: Performed at [facility], [address] [place], [place] [address]    Chemistries  Recent Labs  Lab 02/25/21 1513  NA 135  K 3.7  CL 99  CO2  26  GLUCOSE 90  BUN 8  CREATININE 0.52  CALCIUM 9.1  AST 31  ALT 36  ALKPHOS 103  BILITOT 0.9   ------------------------------------------------------------------------------------------------------------------  ------------------------------------------------------------------------------------------------------------------ GFR: Estimated Creatinine Clearance: 92.6 mL/min (by C-G formula based on SCr of 0.52 mg/dL). Liver Function Tests: Recent Labs  Lab 02/25/21 1513  AST 31  ALT 36  ALKPHOS 103  BILITOT 0.9  PROT 7.8  ALBUMIN 4.1   No results for input(s): LIPASE, AMYLASE in the last 168 hours. No results for input(s): AMMONIA in the last 168 hours. Coagulation Profile: Recent Labs  Lab 02/25/21 1513  INR 1.0   Cardiac Enzymes: No results for input(s): CKTOTAL, CKMB, CKMBINDEX, TROPONINI in the last 168 hours. BNP (last 3  results) No results for input(s): PROBNP in the last 8760 hours. HbA1C: No results for input(s): HGBA1C in the last 72 hours. CBG: No results for input(s): GLUCAP in the last 168 hours. Lipid Profile: No results for input(s): CHOL, HDL, LDLCALC, TRIG, CHOLHDL, LDLDIRECT in the last 72 hours. Thyroid Function Tests: No results for input(s): TSH, T4TOTAL, FREET4, T3FREE, THYROIDAB in the last 72 hours. Anemia Panel: No results for input(s): VITAMINB12, FOLATE, FERRITIN, TIBC, IRON, RETICCTPCT in the last 72 hours.  --------------------------------------------------------------------------------------------------------------- Urine analysis:    Component Value Date/Time   COLORURINE YELLOW 02/25/2021 1421   APPEARANCEUR CLEAR 02/25/2021 1421   LABSPEC 1.010 02/25/2021 1421   PHURINE 7.0 02/25/2021 1421   GLUCOSEU NEGATIVE 02/25/2021 1421   HGBUR NEGATIVE 02/25/2021 1421   BILIRUBINUR NEGATIVE 02/25/2021 1421   KETONESUR NEGATIVE 02/25/2021 1421   PROTEINUR NEGATIVE 02/25/2021 1421   UROBILINOGEN 1.0 05/22/2013 0351   NITRITE NEGATIVE  02/25/2021 1421   LEUKOCYTESUR NEGATIVE 02/25/2021 1421      Imaging Results:    CT Angio Chest Pulmonary Embolism (PE) W or WO Contrast  Result Date: 02/25/2021 CLINICAL DATA:  Weakness. History of breast cancer, currently on chemotherapy. EXAM: CT ANGIOGRAPHY CHEST WITH CONTRAST TECHNIQUE: Multidetector CT imaging of the chest was performed using the standard protocol during bolus administration of intravenous contrast. Multiplanar CT image reconstructions and MIPs were obtained to evaluate the vascular anatomy. CONTRAST:  48mL OMNIPAQUE IOHEXOL 350 MG/ML SOLN COMPARISON:  Chest x-ray from same day. FINDINGS: Cardiovascular: Satisfactory opacification of the pulmonary arteries to the segmental level. No evidence of pulmonary embolism. Normal heart size. No pericardial effusion. No thoracic aortic aneurysm or dissection. Right chest wall port catheter with tip in the distal SVC. 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 Prior left axillary lymph node dissection. No enlarged mediastinal, hilar, or axillary lymph nodes. Mildly enlarged left cardiophrenic angle lymph node measuring 12 mm in short axis. Thyroid gland, trachea, and esophagus demonstrate no significant findings. Lungs/Pleura: Multiple small pulmonary nodules in both lungs measuring up to 4 mm. No focal consolidation, pleural effusion, or pneumothorax. Upper Abdomen: No acute abnormality. Musculoskeletal: Bilateral breast implant reconstruction. 3.2 x 5.5 cm mass in the upper left breast with overlying skin thickening. No acute or significant osseous findings. Review of the MIP images confirms the above findings. IMPRESSION: 1. No evidence of pulmonary embolism. 2. 5.5 cm mass in the upper left breast, consistent with known breast cancer. 3. Mildly enlarged left cardiophrenic angle lymph node, concerning for nodal metastatic disease. 4. Multiple small pulmonary nodules in both lungs measuring up to 4 mm,  nonspecific. Pulmonary metastatic disease is not excluded. Attention on follow-up imaging is recommended. Electronically Signed   By: Titus Dubin M.D.   On: 02/25/2021 21:35   DG Chest Port 1 View  Result Date: 02/25/2021 CLINICAL DATA:  Questionable sepsis. Chemotherapy treatment for breast cancer. EXAM: PORTABLE CHEST 1 VIEW COMPARISON:  None. FINDINGS: Right chest port catheter tip projects over the SVC. The heart size and mediastinal contours are within normal limits. Both lungs are clear. The visualized skeletal structures are unremarkable. There are surgical clips in the left axilla. IMPRESSION: No active disease. Electronically Signed   By: Ronney Asters M.D.   On: 02/25/2021 15:02    My personal review of EKG: Rhythm sinus tachycardia with a rate of 103, QTc 455, nonspecific ST segment changes   Assessment & Plan:    Active Problems:   Dehydration   Tachycardia   Lactic acidosis  Intractable nausea and vomiting   Dehydration Secondary to poor p.o. intake which is secondary to nausea and vomiting 2 L bolus in the ED Continue IV hydration Control nausea Encourage p.o. intake Tachycardia Likely secondary to dehydration Patient does have breast cancer, and reports shortness of breath even at rest, CTA chest was done that does not show PE Tachycardia has improved through the night with more fluids Current heart rate 96, continue to monitor Lactic acidosis Secondary to dehydration Has trended to normal after fluids Continue to monitor Intractable nausea and vomiting Continue Reglan every 8 hours as needed Phenergan for intractable nausea Continue to monitor Pain due to breast cancer Control pain with pain scale Dispo With hydration status improving, patient is able to tolerate p.o. intake she would likely be able to go home within 24 hours    DVT Prophylaxis-   Heparin SCDs   AM Labs Ordered, also please review Full Orders  Family Communication: No family at  bedside  Code Status: Full  Admission status: Observation Time spent in minutes : [place] DO

## 2021-02-26 ENCOUNTER — Other Ambulatory Visit: Payer: Self-pay

## 2021-02-26 DIAGNOSIS — R350 Frequency of micturition: Secondary | ICD-10-CM | POA: Diagnosis present

## 2021-02-26 DIAGNOSIS — R000 Tachycardia, unspecified: Secondary | ICD-10-CM | POA: Diagnosis not present

## 2021-02-26 DIAGNOSIS — R112 Nausea with vomiting, unspecified: Secondary | ICD-10-CM

## 2021-02-26 DIAGNOSIS — E860 Dehydration: Secondary | ICD-10-CM | POA: Diagnosis present

## 2021-02-26 DIAGNOSIS — Z79899 Other long term (current) drug therapy: Secondary | ICD-10-CM | POA: Diagnosis not present

## 2021-02-26 DIAGNOSIS — E872 Acidosis, unspecified: Secondary | ICD-10-CM

## 2021-02-26 DIAGNOSIS — G893 Neoplasm related pain (acute) (chronic): Secondary | ICD-10-CM | POA: Diagnosis present

## 2021-02-26 DIAGNOSIS — Z8249 Family history of ischemic heart disease and other diseases of the circulatory system: Secondary | ICD-10-CM | POA: Diagnosis not present

## 2021-02-26 DIAGNOSIS — Z79891 Long term (current) use of opiate analgesic: Secondary | ICD-10-CM | POA: Diagnosis not present

## 2021-02-26 DIAGNOSIS — T451X5A Adverse effect of antineoplastic and immunosuppressive drugs, initial encounter: Secondary | ICD-10-CM | POA: Diagnosis present

## 2021-02-26 DIAGNOSIS — Z20822 Contact with and (suspected) exposure to covid-19: Secondary | ICD-10-CM | POA: Diagnosis present

## 2021-02-26 DIAGNOSIS — K3184 Gastroparesis: Secondary | ICD-10-CM | POA: Diagnosis present

## 2021-02-26 DIAGNOSIS — Z9882 Breast implant status: Secondary | ICD-10-CM | POA: Diagnosis not present

## 2021-02-26 DIAGNOSIS — C50812 Malignant neoplasm of overlapping sites of left female breast: Secondary | ICD-10-CM | POA: Diagnosis present

## 2021-02-26 LAB — COMPREHENSIVE METABOLIC PANEL
ALT: 49 U/L — ABNORMAL HIGH (ref 0–44)
AST: 35 U/L (ref 15–41)
Albumin: 3.7 g/dL (ref 3.5–5.0)
Alkaline Phosphatase: 100 U/L (ref 38–126)
Anion gap: 7 (ref 5–15)
BUN: 7 mg/dL (ref 6–20)
CO2: 25 mmol/L (ref 22–32)
Calcium: 8.8 mg/dL — ABNORMAL LOW (ref 8.9–10.3)
Chloride: 101 mmol/L (ref 98–111)
Creatinine, Ser: 0.48 mg/dL (ref 0.44–1.00)
GFR, Estimated: >60 mL/min (ref >60)
Glucose, Bld: 101 mg/dL — ABNORMAL HIGH (ref 70–99)
Potassium: 3.8 mmol/L (ref 3.5–5.1)
Sodium: 133 mmol/L — ABNORMAL LOW (ref 135–145)
Total Bilirubin: 1.2 mg/dL (ref 0.3–1.2)
Total Protein: 7.4 g/dL (ref 6.5–8.1)

## 2021-02-26 LAB — PROCALCITONIN: Procalcitonin: <0.1 ng/mL

## 2021-02-26 LAB — MAGNESIUM: Magnesium: 1.9 mg/dL (ref 1.7–2.4)

## 2021-02-26 LAB — LACTIC ACID, PLASMA: Lactic Acid, Venous: 1 mmol/L (ref 0.5–1.9)

## 2021-02-26 LAB — CBC
HCT: 33.8 % — ABNORMAL LOW (ref 36.0–46.0)
Hemoglobin: 11.2 g/dL — ABNORMAL LOW (ref 12.0–15.0)
MCH: 31.3 pg (ref 26.0–34.0)
MCHC: 33.1 g/dL (ref 30.0–36.0)
MCV: 94.4 fL (ref 80.0–100.0)
Platelets: 367 10*3/uL (ref 150–400)
RBC: 3.58 MIL/uL — ABNORMAL LOW (ref 3.87–5.11)
RDW: 11.7 % (ref 11.5–15.5)
WBC: 4.1 10*3/uL (ref 4.0–10.5)
nRBC: 0 % (ref 0.0–0.2)

## 2021-02-26 LAB — APTT: aPTT: 29 seconds (ref 24–36)

## 2021-02-26 LAB — HIV ANTIBODY (ROUTINE TESTING W REFLEX): HIV Screen 4th Generation wRfx: NONREACTIVE

## 2021-02-26 MED ORDER — HEPARIN SODIUM (PORCINE) 5000 UNIT/ML IJ SOLN
5000.0000 [IU] | Freq: Three times a day (TID) | INTRAMUSCULAR | Status: DC
  Administered 2021-02-26 – 2021-02-27 (×4): 5000 [IU] via SUBCUTANEOUS
  Filled 2021-02-26 (×4): qty 1

## 2021-02-26 MED ORDER — TRAMADOL HCL 50 MG PO TABS: 50.0000 mg | ORAL_TABLET | Freq: Two times a day (BID) | ORAL | Status: DC | PRN

## 2021-02-26 MED ORDER — ALBUTEROL SULFATE (2.5 MG/3ML) 0.083% IN NEBU: 2.5000 mg | INHALATION_SOLUTION | RESPIRATORY_TRACT | Status: DC | PRN

## 2021-02-26 MED ORDER — METOCLOPRAMIDE HCL 10 MG PO TABS
10.0000 mg | ORAL_TABLET | Freq: Three times a day (TID) | ORAL | Status: DC | PRN
  Administered 2021-02-26 (×2): 10 mg via ORAL
  Filled 2021-02-26: qty 1

## 2021-02-26 MED ORDER — DIPHENHYDRAMINE HCL 50 MG/ML IJ SOLN
25.0000 mg | Freq: Once | INTRAMUSCULAR | Status: AC
  Administered 2021-02-26: 25 mg via INTRAVENOUS
  Filled 2021-02-26: qty 1

## 2021-02-26 MED ORDER — PROMETHAZINE HCL 25 MG/ML IJ SOLN
INTRAMUSCULAR | Status: AC
  Filled 2021-02-26: qty 1

## 2021-02-26 MED ORDER — CHLORHEXIDINE GLUCONATE CLOTH 2 % EX PADS
6.0000 | MEDICATED_PAD | Freq: Every day | CUTANEOUS | Status: DC
  Administered 2021-02-26 – 2021-02-27 (×2): 6 via TOPICAL

## 2021-02-26 MED ORDER — ALBUTEROL SULFATE (2.5 MG/3ML) 0.083% IN NEBU
2.5000 mg | INHALATION_SOLUTION | Freq: Four times a day (QID) | RESPIRATORY_TRACT | Status: DC
  Administered 2021-02-26: 2.5 mg via RESPIRATORY_TRACT
  Filled 2021-02-26: qty 3

## 2021-02-26 MED ORDER — SODIUM CHLORIDE 0.9 % IV SOLN: INTRAVENOUS | Status: DC

## 2021-02-26 MED ORDER — ACETAMINOPHEN 325 MG PO TABS: 650.0000 mg | ORAL_TABLET | Freq: Four times a day (QID) | ORAL | Status: DC | PRN

## 2021-02-26 MED ORDER — OXYCODONE-ACETAMINOPHEN 5-325 MG PO TABS
1.0000 | ORAL_TABLET | ORAL | Status: DC
  Administered 2021-02-26: 1 via ORAL
  Filled 2021-02-26 (×2): qty 1

## 2021-02-26 MED ORDER — AMITRIPTYLINE HCL 25 MG PO TABS
100.0000 mg | ORAL_TABLET | Freq: Every day | ORAL | Status: DC
  Administered 2021-02-26: 100 mg via ORAL
  Filled 2021-02-26: qty 4

## 2021-02-26 MED ORDER — METOCLOPRAMIDE HCL 5 MG/ML IJ SOLN
10.0000 mg | Freq: Four times a day (QID) | INTRAMUSCULAR | Status: DC
  Administered 2021-02-27 (×2): 10 mg via INTRAVENOUS
  Filled 2021-02-26 (×3): qty 2

## 2021-02-26 MED ORDER — ACETAMINOPHEN 650 MG RE SUPP: 650.0000 mg | Freq: Four times a day (QID) | RECTAL | Status: DC | PRN

## 2021-02-26 NOTE — Plan of Care (Signed)

## 2021-02-26 NOTE — Care Management Obs Status (Signed)
[facility] NOTIFICATION   Patient Details  Name: April Moreno MRN: [id number] Date of Birth: 11/08/1985   Medicare Observation Status Notification Given:  Yes    Boneta Lucks, RN 02/26/2021, 12:33 PM

## 2021-02-26 NOTE — Progress Notes (Signed)
Pt arrived to room #325 via stretcher from ED. Pt tearful on arrival, states, "I just feel so bad!". Pt able to stand and ambulate 3 steps from stretcher to bed with standby assistance, states feels very weak. Pt holding emesis bag, states having nausea. IVF infusing via right chest port-a-cath without difficulty. Pt placed on tele, reading ST 115-120/min, which is unchanged from readings obtained in ED. All other VSS.   Pt has large bandaid to left breast, removal reveals scabbed, mostly circular wound with scant amount to serosanguinous drainage. Pt c/o pain in left breast, left axilla and left arm. Left breast firm to palpation. Pt allowed to assume position of comfort in bed, safety procedures explained and pt states understanding. Call bell at bedside.

## 2021-02-26 NOTE — Progress Notes (Signed)
PROGRESS NOTE   April Moreno  WUJ:[id number] DOB: 25-Oct-1985 DOA: 02/25/2021 PCP: Pcp, No   No chief complaint on file.  Level of care: Telemetry  Brief Admission History:  35 y.o. female, with history of breast cancer, GERD, hypertension, IBS, presents the ED with a chief complaint of bad reaction to chemo.  Patient reports that she has had extreme generalized weakness starting yesterday.  She reports that her last chemo was 2 days prior to that.  That was actually the first chemo of this course for her recurrent breast cancer.  She is not had any radiation yet.  She follows with [facility] for oncology.  Patient reports that she was so weak she was having difficulty even holding her head up.  She reports that she is normally able to ambulate independently, but has been having to hold onto furniture and walls to ambulate the past 2 days.   Assessment & Plan:   Active Problems:   Dehydration   Tachycardia   Lactic acidosis   Intractable nausea and vomiting   Dehydration  -Improving with IV fluids however p.o. intake remains poor -Continue IV fluids at this time  Nausea and vomiting -Secondary to chemotherapy treatments -Continue supportive management and nausea medications  Sinus tachycardia -Secondary to cancer -Continue supportive management  Recurrent breast cancer -Patient currently undergoing chemotherapy with oncology  Lactic acidosis -Treated and resolved after IV fluids  Breast cancer pain -Continue pain management as ordered  DVT prophylaxis: Heparin, SCDs Code Status: Full Family Communication: Plan of care discussed with patient at bedside Disposition: Anticipate home 02-27-21 Status is: Observation  The patient remains OBS appropriate and will d/c before 2 midnights.  Dispo: The patient is from: Home              Anticipated d/c is to: Home              Patient currently is not medically stable to d/c.   Difficult to place patient No        Consultants:  N/A  Procedures:  N/A  Antimicrobials:  N/A  Subjective: Patient reports that she feels terrible, persistent nausea and vomiting and poor oral intake and breast cancer pain  Objective: Vitals:   02/26/21 0800 02/26/21 0820 02/26/21 0914 02/26/21 1000  BP: 114/82  104/85 114/85  Pulse: 97  (!) 115 99  Resp: 17  12 17   Temp:   98.9 F (37.2 C) 97.9 F (36.6 C)  TempSrc:   Oral Oral  SpO2: 96% 99% 96% 98%  Weight:      Height:        Intake/Output Summary (Last 24 hours) at 02/26/2021 1342 Last data filed at 02/25/2021 1704 Gross per 24 hour  Intake 1000 ml  Output --  Net 1000 ml   Filed Weights   02/25/21 1401  Weight: 77.1 kg    Examination:  General exam: Appears calm and comfortable  Respiratory system: Clear to auscultation. Respiratory effort normal. Cardiovascular system: normal S1 & S2 heard.  Tachycardic rate, no JVD, murmurs, rubs, gallops or clicks. No pedal edema. Gastrointestinal system: Abdomen is nondistended, soft and nontender. No organomegaly or masses felt. Normal bowel sounds heard. Central nervous system: Alert and oriented. No focal neurological deficits. Extremities: Symmetric 5 x 5 power. Skin: No rashes, lesions or ulcers Psychiatry: Judgement and insight appear normal. Mood & affect appropriate.   Data Reviewed: I have personally reviewed following labs and imaging studies  CBC: Recent Labs  Lab 02/25/21 1513  02/26/21 0952  WBC 6.1 4.1  NEUTROABS 3.2  --   HGB 12.4 11.2*  HCT 37.7 33.8*  MCV 94.3 94.4  PLT 419* 782    Basic Metabolic Panel: Recent Labs  Lab 02/25/21 1513 02/26/21 0952  NA 135 133*  K 3.7 3.8  CL 99 101  CO2 26 25  GLUCOSE 90 101*  BUN 8 7  CREATININE 0.52 0.48  CALCIUM 9.1 8.8*  MG  --  1.9    GFR: Estimated Creatinine Clearance: 92.6 mL/min (by C-G formula based on SCr of 0.48 mg/dL).  Liver Function Tests: Recent Labs  Lab 02/25/21 1513 02/26/21 0952  AST 31 35  ALT 36  49*  ALKPHOS 103 100  BILITOT 0.9 1.2  PROT 7.8 7.4  ALBUMIN 4.1 3.7    CBG: No results for input(s): GLUCAP in the last 168 hours.  Recent Results (from the past 240 hour(s))  Blood Culture (routine x 2)     Status: None (Preliminary result)   Collection Time: 02/25/21  3:19 PM   Specimen: Porta Cath; Blood  Result Value Ref Range Status   Specimen Description PORTA CATH  Final   Special Requests   Final    Blood Culture adequate volume BOTTLES DRAWN AEROBIC AND ANAEROBIC   Culture   Final    NO GROWTH < 24 HOURS Performed at [facility], [address], [place], [place] [address]    Report Status PENDING  Incomplete  Resp Panel by RT-PCR (Flu A&B, Covid) Nasopharyngeal Swab     Status: None   Collection Time: 02/25/21  3:33 PM   Specimen: Nasopharyngeal Swab; Nasopharyngeal(NP) swabs in vial transport medium  Result Value Ref Range Status   SARS Coronavirus 2 by RT PCR NEGATIVE NEGATIVE Final    Comment: (NOTE) SARS-CoV-2 target nucleic acids are NOT DETECTED.  The SARS-CoV-2 RNA is generally detectable in upper respiratory specimens during the acute phase of infection. The lowest concentration of SARS-CoV-2 viral copies this assay can detect is 138 copies/mL. A negative result does not preclude SARS-Cov-2 infection and should not be used as the sole basis for treatment or other patient management decisions. A negative result may occur with  improper specimen collection/handling, submission of specimen other than nasopharyngeal swab, presence of viral mutation(s) within the areas targeted by this assay, and inadequate number of viral copies(<138 copies/mL). A negative result must be combined with clinical observations, patient history, and epidemiological information. The expected result is Negative.  Fact Sheet for Patients:  EntrepreneurPulse.com.au  Fact Sheet for Healthcare Providers:  IncredibleEmployment.be  This test is no  t yet approved or cleared by the [place] FDA and  has been authorized for detection and/or diagnosis of SARS-CoV-2 by FDA under an Emergency Use Authorization (EUA). This EUA will remain  in effect (meaning this test can be used) for the duration of the COVID-19 declaration under Section 564(b)(1) of the Act, 21 U.S.C.section 360bbb-3(b)(1), unless the authorization is terminated  or revoked sooner.       Influenza A by PCR NEGATIVE NEGATIVE Final   Influenza B by PCR NEGATIVE NEGATIVE Final    Comment: (NOTE) The Xpert Xpress SARS-CoV-2/FLU/RSV plus assay is intended as an aid in the diagnosis of influenza from Nasopharyngeal swab specimens and should not be used as a sole basis for treatment. Nasal washings and aspirates are unacceptable for Xpert Xpress SARS-CoV-2/FLU/RSV testing.  Fact Sheet for Patients: EntrepreneurPulse.com.au  Fact Sheet for Healthcare Providers: IncredibleEmployment.be  This test is not yet  approved or cleared by the [place] and has been authorized for detection and/or diagnosis of SARS-CoV-2 by FDA under an Emergency Use Authorization (EUA). This EUA will remain in effect (meaning this test can be used) for the duration of the COVID-19 declaration under Section 564(b)(1) of the Act, 21 U.S.C. section 360bbb-3(b)(1), unless the authorization is terminated or revoked.  Performed at [facility], [address], [place], [place] [address]   Blood Culture (routine x 2)     Status: None (Preliminary result)   Collection Time: 02/25/21  5:19 PM   Specimen: BLOOD RIGHT HAND  Result Value Ref Range Status   Specimen Description BLOOD RIGHT HAND  Final   Special Requests   Final    Blood Culture adequate volume BOTTLES DRAWN AEROBIC AND ANAEROBIC   Culture   Final    NO GROWTH < 12 HOURS Performed at [facility], [address] [place], [place] [address]    Report Status PENDING  Incomplete      Radiology Studies: CT Angio Chest Pulmonary Embolism (PE) W or WO Contrast  Result Date: 02/25/2021 CLINICAL DATA:  Weakness. History of breast cancer, currently on chemotherapy. EXAM: CT ANGIOGRAPHY CHEST WITH CONTRAST TECHNIQUE: Multidetector CT imaging of the chest was performed using the standard protocol during bolus administration of intravenous contrast. Multiplanar CT image reconstructions and MIPs were obtained to evaluate the vascular anatomy. CONTRAST:  74mL OMNIPAQUE IOHEXOL 350 MG/ML SOLN COMPARISON:  Chest x-ray from same day. FINDINGS: Cardiovascular: Satisfactory opacification of the pulmonary arteries to the segmental level. No evidence of pulmonary embolism. Normal heart size. No pericardial effusion. No thoracic aortic aneurysm or dissection. Right chest wall port catheter with tip in the distal SVC. 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 Prior left axillary lymph node dissection. No enlarged mediastinal, hilar, or axillary lymph nodes. Mildly enlarged left cardiophrenic angle lymph node measuring 12 mm in short axis. Thyroid gland, trachea, and esophagus demonstrate no significant findings. Lungs/Pleura: Multiple small pulmonary nodules in both lungs measuring up to 4 mm. No focal consolidation, pleural effusion, or pneumothorax. Upper Abdomen: No acute abnormality. Musculoskeletal: Bilateral breast implant reconstruction. 3.2 x 5.5 cm mass in the upper left breast with overlying skin thickening. No acute or significant osseous findings. Review of the MIP images confirms the above findings. IMPRESSION: 1. No evidence of pulmonary embolism. 2. 5.5 cm mass in the upper left breast, consistent with known breast cancer. 3. Mildly enlarged left cardiophrenic angle lymph node, concerning for nodal metastatic disease. 4. Multiple small pulmonary nodules in both lungs measuring up to 4 mm, nonspecific. Pulmonary metastatic disease is not excluded. Attention on  follow-up imaging is recommended. Electronically Signed   By: Titus Dubin M.D.   On: 02/25/2021 21:35   DG Chest Port 1 View  Result Date: 02/25/2021 CLINICAL DATA:  Questionable sepsis. Chemotherapy treatment for breast cancer. EXAM: PORTABLE CHEST 1 VIEW COMPARISON:  None. FINDINGS: Right chest port catheter tip projects over the SVC. The heart size and mediastinal contours are within normal limits. Both lungs are clear. The visualized skeletal structures are unremarkable. There are surgical clips in the left axilla. IMPRESSION: No active disease. Electronically Signed   By: Ronney Asters M.D.   On: 02/25/2021 15:02    Scheduled Meds:  amitriptyline  100 mg Oral QHS   heparin  5,000 Units Subcutaneous Q8H   oxyCODONE-acetaminophen  1 tablet Oral QODAY   Continuous Infusions:  sodium chloride 75 mL/hr at 02/26/21 [phone]  promethazine (PHENERGAN) injection (IM or IVPB) 12.5 mg (02/26/21 1137)    LOS: 0 days   Time spent: 35 mins   Zayah Keilman Wynetta Emery, MD How to contact the [facility] Attending or Consulting provider [place] or covering provider during after hours [place], for this patient?  Check the care team in [facility] and look for a) attending/consulting [facility] provider listed and b) the [facility] team listed Log into [email or web address] and use [facility]'s universal password to access. If you do not have the password, please contact the hospital operator. Locate the [facility] provider you are looking for under [facility] and page to a number that you can be directly reached. If you still have difficulty reaching the provider, please page the [facility] (Director on Call) for the Hospitalists listed on amion for assistance.  02/26/2021, 1:42 PM

## 2021-02-27 DIAGNOSIS — E872 Acidosis, unspecified: Secondary | ICD-10-CM | POA: Diagnosis not present

## 2021-02-27 DIAGNOSIS — R000 Tachycardia, unspecified: Secondary | ICD-10-CM | POA: Diagnosis not present

## 2021-02-27 DIAGNOSIS — E860 Dehydration: Secondary | ICD-10-CM | POA: Diagnosis not present

## 2021-02-27 DIAGNOSIS — R112 Nausea with vomiting, unspecified: Secondary | ICD-10-CM | POA: Diagnosis not present

## 2021-02-27 MED ORDER — HEPARIN SOD (PORK) LOCK FLUSH 100 UNIT/ML IV SOLN
500.0000 [IU] | Freq: Once | INTRAVENOUS | Status: AC
  Administered 2021-02-27: 500 [IU] via INTRAVENOUS
  Filled 2021-02-27: qty 5

## 2021-02-27 NOTE — Discharge Summary (Signed)
Physician Discharge Summary  April Moreno:[id number] DOB: [birth year] DOA: 02/25/2021  Admit date: 02/25/2021 Discharge date: 02/27/2021  Admitted From:  Home  Disposition: Home   Recommendations for Outpatient Follow-up:  Follow up with oncologist in 3-5 days   Discharge Condition: STABLE   CODE STATUS: FULL DIET: soft foods as tolerated    Brief Hospitalization Summary: Please see all hospital notes, images, labs for full details of the hospitalization. ADMISSION HPI:  April Moreno  is a 35 y.o. female, with history of breast cancer, GERD, hypertension, IBS, presents the ED with a chief complaint of bad reaction to chemo.  Patient reports that she has had extreme generalized weakness starting yesterday.  She reports that her last chemo was 2 days prior to that.  That was actually the first chemo of this course for her recurrent breast cancer.  She is not had any radiation yet.  She follows with [facility] for oncology.  Patient reports that she was so weak she was having difficulty even holding her head up.  She reports that she is normally able to ambulate independently, but has been having to hold onto furniture and walls to ambulate the past 2 days.  Her last normal meal was 3 days ago.  She has had nausea and vomiting approximately 2 times a day.  Its nonbloody.  She has had loose stools approximately 2 times a day are also nonbloody.  She reports increase in urine frequency, but no dysuria, flank pain, hematuria.  Patient denies cough but she admits to dyspnea even at rest.  She admits to palpitations that are both from anxiety, and sometimes associated with ambulation.  Patient denies any fever or chills.  Patient takes Reglan for gastroparesis.  It helps with her nausea.  Zofran does not help with her nausea at all.  Patient has been taking THC edibles her last 1 was 2 days ago.  Patient does not drink, does not smoke cigarettes, does not use other illicit drugs.  Patient is vaccinated for COVID.   Patient reports this is worse than she has ever felt with chemo before. Patient has no other complaints at this time.   In the ED Temp 98.3, heart rate 103-117, respiratory rate 12-20, blood pressure 112/82, satting 100% Lactic acid initially 1.4 and then 2.4 and then trending back down to 1.0. No leukocytosis, hemoglobin 12.4 Thrombocytosis 419 Chemistry panel is unremarkable APTT initially greater than 200, repeat normal at 25 Chest x-ray shows no active disease Fentanyl given for chest wall pain, Reglan, Zofran, and 2 L normal saline bolus given in the ED.  Hospital Course by problem list   Dehydration  -Improving with IV fluids however p.o. intake remains poor -Treated with IV fluids   Nausea and vomiting -Secondary to chemotherapy treatments -Continue supportive management and nausea medications   Sinus tachycardia -Secondary to cancer -Continue supportive management   Recurrent breast cancer -Patient currently undergoing chemotherapy with oncology - Follow up with oncologist in 3-5 days   Lactic acidosis -Treated and resolved after IV fluids   Breast cancer pain -Continue pain management as ordered   DVT prophylaxis: Heparin, SCDs Code Status: Full Family Communication: Plan of care discussed with patient at bedside Disposition: DC home 02-27-21   Discharge Diagnoses:  Active Problems:   Dehydration   Tachycardia   Lactic acidosis   Intractable nausea and vomiting  Discharge Instructions:  Allergies as of 02/27/2021       Reactions   Amoxicillin    Latex  Medication List     STOP taking these medications    dicyclomine 10 MG capsule Commonly known as: BENTYL   HYDROcodone-acetaminophen 5-325 MG tablet Commonly known as: NORCO/VICODIN   ondansetron 4 MG disintegrating tablet Commonly known as: ZOFRAN-ODT   promethazine 25 MG suppository Commonly known as: PHENERGAN   promethazine 25 MG tablet Commonly known as: PHENERGAN    sulfamethoxazole-trimethoprim 800-160 MG tablet Commonly known as: BACTRIM DS       TAKE these medications    amitriptyline 100 MG tablet Commonly known as: ELAVIL Take 100 mg by mouth at bedtime.   metoCLOPramide 10 MG tablet Commonly known as: REGLAN Take 10 mg by mouth 2 (two) times daily.   oxyCODONE-acetaminophen 5-325 MG tablet Commonly known as: PERCOCET/ROXICET Take 1 tablet by mouth every other day.   traMADol 50 MG tablet Commonly known as: ULTRAM Take 50 mg by mouth every 12 (twelve) hours as needed.        Follow-up Information     Oncologist. Schedule an appointment as soon as possible for a visit in 3 day(s).   Why: Hospital Follow Up               Allergies  Allergen Reactions   Amoxicillin    Latex    Allergies as of 02/27/2021       Reactions   Amoxicillin    Latex         Medication List     STOP taking these medications    dicyclomine 10 MG capsule Commonly known as: BENTYL   HYDROcodone-acetaminophen 5-325 MG tablet Commonly known as: NORCO/VICODIN   ondansetron 4 MG disintegrating tablet Commonly known as: ZOFRAN-ODT   promethazine 25 MG suppository Commonly known as: PHENERGAN   promethazine 25 MG tablet Commonly known as: PHENERGAN   sulfamethoxazole-trimethoprim 800-160 MG tablet Commonly known as: BACTRIM DS       TAKE these medications    amitriptyline 100 MG tablet Commonly known as: ELAVIL Take 100 mg by mouth at bedtime.   metoCLOPramide 10 MG tablet Commonly known as: REGLAN Take 10 mg by mouth 2 (two) times daily.   oxyCODONE-acetaminophen 5-325 MG tablet Commonly known as: PERCOCET/ROXICET Take 1 tablet by mouth every other day.   traMADol 50 MG tablet Commonly known as: ULTRAM Take 50 mg by mouth every 12 (twelve) hours as needed.        Procedures/Studies: CT Angio Chest Pulmonary Embolism (PE) W or WO Contrast  Result Date: 02/25/2021 CLINICAL DATA:  Weakness. History of breast  cancer, currently on chemotherapy. EXAM: CT ANGIOGRAPHY CHEST WITH CONTRAST TECHNIQUE: Multidetector CT imaging of the chest was performed using the standard protocol during bolus administration of intravenous contrast. Multiplanar CT image reconstructions and MIPs were obtained to evaluate the vascular anatomy. CONTRAST:  77mL OMNIPAQUE IOHEXOL 350 MG/ML SOLN COMPARISON:  Chest x-ray from same day. FINDINGS: Cardiovascular: Satisfactory opacification of the pulmonary arteries to the segmental level. No evidence of pulmonary embolism. Normal heart size. No pericardial effusion. No thoracic aortic aneurysm or dissection. Right chest wall port catheter with tip in the distal SVC. 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 Prior left axillary lymph node dissection. No enlarged mediastinal, hilar, or axillary lymph nodes. Mildly enlarged left cardiophrenic angle lymph node measuring 12 mm in short axis. Thyroid gland, trachea, and esophagus demonstrate no significant findings. Lungs/Pleura: Multiple small pulmonary nodules in both lungs measuring up to 4 mm. No focal consolidation, pleural effusion, or pneumothorax. Upper Abdomen: No acute abnormality.  Musculoskeletal: Bilateral breast implant reconstruction. 3.2 x 5.5 cm mass in the upper left breast with overlying skin thickening. No acute or significant osseous findings. Review of the MIP images confirms the above findings. IMPRESSION: 1. No evidence of pulmonary embolism. 2. 5.5 cm mass in the upper left breast, consistent with known breast cancer. 3. Mildly enlarged left cardiophrenic angle lymph node, concerning for nodal metastatic disease. 4. Multiple small pulmonary nodules in both lungs measuring up to 4 mm, nonspecific. Pulmonary metastatic disease is not excluded. Attention on follow-up imaging is recommended. Electronically Signed   By: Titus Dubin M.D.   On: 02/25/2021 21:35   DG Chest Port 1 View  Result Date:  02/25/2021 CLINICAL DATA:  Questionable sepsis. Chemotherapy treatment for breast cancer. EXAM: PORTABLE CHEST 1 VIEW COMPARISON:  None. FINDINGS: Right chest port catheter tip projects over the SVC. The heart size and mediastinal contours are within normal limits. Both lungs are clear. The visualized skeletal structures are unremarkable. There are surgical clips in the left axilla. IMPRESSION: No active disease. Electronically Signed   By: Ronney Asters M.D.   On: 02/25/2021 15:02     Subjective: Pt reports feeling better.  She is able to tolerate orals. She feels she can manage at home now.  She reports that she is following up with her oncologist.   Discharge Exam: Vitals:   02/26/21 2126 02/27/21 0518  BP: (!) 128/101 105/79  Pulse: (!) 115 (!) 118  Resp: 20 19  Temp: (!) 97.5 F (36.4 C) 97.9 F (36.6 C)  SpO2: 97% 99%   Vitals:   02/26/21 0914 02/26/21 1000 02/26/21 2126 02/27/21 0518  BP: 104/85 114/85 (!) 128/101 105/79  Pulse: (!) 115 99 (!) 115 (!) 118  Resp: 12 17 20 19   Temp: 98.9 F (37.2 C) 97.9 F (36.6 C) (!) 97.5 F (36.4 C) 97.9 F (36.6 C)  TempSrc: Oral Oral Oral Oral  SpO2: 96% 98% 97% 99%  Weight:      Height:       General: Pt is alert, awake, not in acute distress Cardiovascular: normal S1/S2 +, tachycardic rate, no rubs, no gallops Respiratory: CTA bilaterally, no wheezing, no rhonchi Abdominal: Soft, NT, ND, bowel sounds + Extremities: no edema, no cyanosis   The results of significant diagnostics from this hospitalization (including imaging, microbiology, ancillary and laboratory) are listed below for reference.     Microbiology: Recent Results (from the past 240 hour(s))  Blood Culture (routine x 2)     Status: None (Preliminary result)   Collection Time: 02/25/21  3:19 PM   Specimen: Porta Cath; Blood  Result Value Ref Range Status   Specimen Description PORTA CATH  Final   Special Requests   Final    Blood Culture adequate volume BOTTLES  DRAWN AEROBIC AND ANAEROBIC   Culture   Final    NO GROWTH 2 DAYS Performed at [facility], [address], [place],  [address]    Report Status PENDING  Incomplete  Resp Panel by RT-PCR (Flu A&B, Covid) Nasopharyngeal Swab     Status: None   Collection Time: 02/25/21  3:33 PM   Specimen: Nasopharyngeal Swab; Nasopharyngeal(NP) swabs in vial transport medium  Result Value Ref Range Status   SARS Coronavirus 2 by RT PCR NEGATIVE NEGATIVE Final    Comment: (NOTE) SARS-CoV-2 target nucleic acids are NOT DETECTED.  The SARS-CoV-2 RNA is generally detectable in upper respiratory specimens during the acute phase of infection. The lowest concentration of  SARS-CoV-2 viral copies this assay can detect is 138 copies/mL. A negative result does not preclude SARS-Cov-2 infection and should not be used as the sole basis for treatment or other patient management decisions. A negative result may occur with  improper specimen collection/handling, submission of specimen other than nasopharyngeal swab, presence of viral mutation(s) within the areas targeted by this assay, and inadequate number of viral copies(<138 copies/mL). A negative result must be combined with clinical observations, patient history, and epidemiological information. The expected result is Negative.  Fact Sheet for Patients:  EntrepreneurPulse.com.au  Fact Sheet for Healthcare Providers:  IncredibleEmployment.be  This test is no t yet approved or cleared by the [place] FDA and  has been authorized for detection and/or diagnosis of SARS-CoV-2 by FDA under an Emergency Use Authorization (EUA). This EUA will remain  in effect (meaning this test can be used) for the duration of the COVID-19 declaration under Section 564(b)(1) of the Act, 21 U.S.C.section 360bbb-3(b)(1), unless the authorization is terminated  or revoked sooner.       Influenza A by PCR NEGATIVE NEGATIVE Final    Influenza B by PCR NEGATIVE NEGATIVE Final    Comment: (NOTE) The Xpert Xpress SARS-CoV-2/FLU/RSV plus assay is intended as an aid in the diagnosis of influenza from Nasopharyngeal swab specimens and should not be used as a sole basis for treatment. Nasal washings and aspirates are unacceptable for Xpert Xpress SARS-CoV-2/FLU/RSV testing.  Fact Sheet for Patients: EntrepreneurPulse.com.au  Fact Sheet for Healthcare Providers: IncredibleEmployment.be  This test is not yet approved or cleared by the [place] FDA and has been authorized for detection and/or diagnosis of SARS-CoV-2 by FDA under an Emergency Use Authorization (EUA). This EUA will remain in effect (meaning this test can be used) for the duration of the COVID-19 declaration under Section 564(b)(1) of the Act, 21 U.S.C. section 360bbb-3(b)(1), unless the authorization is terminated or revoked.  Performed at [facility], [address] [place], [place] [address]   Blood Culture (routine x 2)     Status: None (Preliminary result)   Collection Time: 02/25/21  5:19 PM   Specimen: BLOOD RIGHT HAND  Result Value Ref Range Status   Specimen Description BLOOD RIGHT HAND  Final   Special Requests   Final    Blood Culture adequate volume BOTTLES DRAWN AEROBIC AND ANAEROBIC   Culture   Final    NO GROWTH 2 DAYS Performed at [facility], [address] [place], [place] [address]    Report Status PENDING  Incomplete     Labs: BNP (last 3 results) No results for input(s): BNP in the last 8760 hours. Basic Metabolic Panel: Recent Labs  Lab 02/25/21 1513 02/26/21 0952  NA 135 133*  K 3.7 3.8  CL 99 101  CO2 26 25  GLUCOSE 90 101*  BUN 8 7  CREATININE 0.52 0.48  CALCIUM 9.1 8.8*  MG  --  1.9   Liver Function Tests: Recent Labs  Lab 02/25/21 1513 02/26/21 0952  AST 31 35  ALT 36 49*  ALKPHOS 103 100  BILITOT 0.9 1.2  PROT 7.8 7.4  ALBUMIN 4.1 3.7   No results for  input(s): LIPASE, AMYLASE in the last 168 hours. No results for input(s): AMMONIA in the last 168 hours. CBC: Recent Labs  Lab 02/25/21 1513 02/26/21 0952  WBC 6.1 4.1  NEUTROABS 3.2  --   HGB 12.4 11.2*  HCT 37.7 33.8*  MCV 94.3 94.4  PLT 419* 367   Cardiac  Enzymes: No results for input(s): CKTOTAL, CKMB, CKMBINDEX, TROPONINI in the last 168 hours. BNP: Invalid input(s): POCBNP CBG: No results for input(s): GLUCAP in the last 168 hours. D-Dimer No results for input(s): DDIMER in the last 72 hours. Hgb A1c No results for input(s): HGBA1C in the last 72 hours. Lipid Profile No results for input(s): CHOL, HDL, LDLCALC, TRIG, CHOLHDL, LDLDIRECT in the last 72 hours. Thyroid function studies No results for input(s): TSH, T4TOTAL, T3FREE, THYROIDAB in the last 72 hours.  Invalid input(s): FREET3 Anemia work up No results for input(s): VITAMINB12, FOLATE, FERRITIN, TIBC, IRON, RETICCTPCT in the last 72 hours. Urinalysis    Component Value Date/Time   COLORURINE YELLOW 02/25/2021 1421   APPEARANCEUR CLEAR 02/25/2021 1421   LABSPEC 1.010 02/25/2021 1421   PHURINE 7.0 02/25/2021 1421   GLUCOSEU NEGATIVE 02/25/2021 1421   HGBUR NEGATIVE 02/25/2021 1421   BILIRUBINUR NEGATIVE 02/25/2021 1421   KETONESUR NEGATIVE 02/25/2021 1421   PROTEINUR NEGATIVE 02/25/2021 1421   UROBILINOGEN 1.0 05/22/2013 0351   NITRITE NEGATIVE 02/25/2021 1421   LEUKOCYTESUR NEGATIVE 02/25/2021 1421   Sepsis Labs Invalid input(s): PROCALCITONIN,  WBC,  LACTICIDVEN Microbiology Recent Results (from the past 240 hour(s))  Blood Culture (routine x 2)     Status: None (Preliminary result)   Collection Time: 02/25/21  3:19 PM   Specimen: Porta Cath; Blood  Result Value Ref Range Status   Specimen Description PORTA CATH  Final   Special Requests   Final    Blood Culture adequate volume BOTTLES DRAWN AEROBIC AND ANAEROBIC   Culture   Final    NO GROWTH 2 DAYS Performed at [facility], [address], [place], [place] [address]    Report Status PENDING  Incomplete  Resp Panel by RT-PCR (Flu A&B, Covid) Nasopharyngeal Swab     Status: None   Collection Time: 02/25/21  3:33 PM   Specimen: Nasopharyngeal Swab; Nasopharyngeal(NP) swabs in vial transport medium  Result Value Ref Range Status   SARS Coronavirus 2 by RT PCR NEGATIVE NEGATIVE Final    Comment: (NOTE) SARS-CoV-2 target nucleic acids are NOT DETECTED.  The SARS-CoV-2 RNA is generally detectable in upper respiratory specimens during the acute phase of infection. The lowest concentration of SARS-CoV-2 viral copies this assay can detect is 138 copies/mL. A negative result does not preclude SARS-Cov-2 infection and should not be used as the sole basis for treatment or other patient management decisions. A negative result may occur with  improper specimen collection/handling, submission of specimen other than nasopharyngeal swab, presence of viral mutation(s) within the areas targeted by this assay, and inadequate number of viral copies(<138 copies/mL). A negative result must be combined with clinical observations, patient history, and epidemiological information. The expected result is Negative.  Fact Sheet for Patients:  EntrepreneurPulse.com.au  Fact Sheet for Healthcare Providers:  IncredibleEmployment.be  This test is no t yet approved or cleared by the [place] FDA and  has been authorized for detection and/or diagnosis of SARS-CoV-2 by FDA under an Emergency Use Authorization (EUA). This EUA will remain  in effect (meaning this test can be used) for the duration of the COVID-19 declaration under Section 564(b)(1) of the Act, 21 U.S.C.section 360bbb-3(b)(1), unless the authorization is terminated  or revoked sooner.       Influenza A by PCR NEGATIVE NEGATIVE Final   Influenza B by PCR NEGATIVE NEGATIVE Final    Comment: (NOTE) The Xpert Xpress SARS-CoV-2/FLU/RSV plus assay  is intended as an aid in the  diagnosis of influenza from Nasopharyngeal swab specimens and should not be used as a sole basis for treatment. Nasal washings and aspirates are unacceptable for Xpert Xpress SARS-CoV-2/FLU/RSV testing.  Fact Sheet for Patients: EntrepreneurPulse.com.au  Fact Sheet for Healthcare Providers: IncredibleEmployment.be  This test is not yet approved or cleared by the [place] FDA and has been authorized for detection and/or diagnosis of SARS-CoV-2 by FDA under an Emergency Use Authorization (EUA). This EUA will remain in effect (meaning this test can be used) for the duration of the COVID-19 declaration under Section 564(b)(1) of the Act, 21 U.S.C. section 360bbb-3(b)(1), unless the authorization is terminated or revoked.  Performed at [facility], [address], [place], [place] [address]   Blood Culture (routine x 2)     Status: None (Preliminary result)   Collection Time: 02/25/21  5:19 PM   Specimen: BLOOD RIGHT HAND  Result Value Ref Range Status   Specimen Description BLOOD RIGHT HAND  Final   Special Requests   Final    Blood Culture adequate volume BOTTLES DRAWN AEROBIC AND ANAEROBIC   Culture   Final    NO GROWTH 2 DAYS Performed at [facility], [address] [place], [place] [address]    Report Status PENDING  Incomplete   Time coordinating discharge:   SIGNED:  Irwin Brakeman, MD  [facility] 02/27/2021, 11:48 AM How to contact the [facility] Attending or Consulting provider [place] or covering provider during after hours [place], for this patient?  Check the care team in [facility] and look for a) attending/consulting [facility] provider listed and b) the [facility] team listed Log into [email or web address] and use [place]'s universal password to access. If you do not have the password, please contact the hospital operator. Locate the [facility] provider you are looking for under [facility] and page to a number that  you can be directly reached. If you still have difficulty reaching the provider, please page the [facility] (Director on Call) for the Hospitalists listed on amion for assistance.

## 2021-02-27 NOTE — Discharge Instructions (Signed)

## 2021-02-27 NOTE — Plan of Care (Signed)

## 2021-03-02 LAB — CULTURE, BLOOD (ROUTINE X 2)
Culture: NO GROWTH
Culture: NO GROWTH
Special Requests: ADEQUATE
Special Requests: ADEQUATE

## 2021-05-07 DIAGNOSIS — R531 Weakness: Secondary | ICD-10-CM

## 2022-04-04 ENCOUNTER — Emergency Department (HOSPITAL_COMMUNITY): Payer: Commercial Managed Care - PPO

## 2022-04-04 ENCOUNTER — Emergency Department (HOSPITAL_COMMUNITY)
Admission: EM | Admit: 2022-04-04 | Discharge: 2022-04-05 | Disposition: A | Discharge status: Home / Self Care | Payer: Commercial Managed Care - PPO | Attending: Emergency Medicine | Admitting: Emergency Medicine

## 2022-04-04 ENCOUNTER — Encounter (HOSPITAL_COMMUNITY): Payer: Self-pay

## 2022-04-04 ENCOUNTER — Other Ambulatory Visit: Payer: Self-pay

## 2022-04-04 DIAGNOSIS — W01198A Fall on same level from slipping, tripping and stumbling with subsequent striking against other object, initial encounter: Secondary | ICD-10-CM | POA: Insufficient documentation

## 2022-04-04 DIAGNOSIS — R799 Abnormal finding of blood chemistry, unspecified: Secondary | ICD-10-CM | POA: Insufficient documentation

## 2022-04-04 DIAGNOSIS — C7931 Secondary malignant neoplasm of brain: Secondary | ICD-10-CM

## 2022-04-04 DIAGNOSIS — R519 Headache, unspecified: Secondary | ICD-10-CM | POA: Diagnosis present

## 2022-04-04 DIAGNOSIS — Z91040 Latex allergy status: Secondary | ICD-10-CM | POA: Insufficient documentation

## 2022-04-04 LAB — CBG MONITORING, ED: Glucose-Capillary: 109 mg/dL — ABNORMAL HIGH (ref 70–99)

## 2022-04-04 LAB — URINALYSIS, ROUTINE W REFLEX MICROSCOPIC
Bilirubin Urine: NEGATIVE
Glucose, UA: NEGATIVE mg/dL
Hgb urine dipstick: NEGATIVE
Ketones, ur: NEGATIVE mg/dL
Leukocytes,Ua: NEGATIVE
Nitrite: NEGATIVE
Protein, ur: NEGATIVE mg/dL
Specific Gravity, Urine: 1.015 (ref 1.005–1.030)
pH: 7 (ref 5.0–8.0)

## 2022-04-04 LAB — CBC
HCT: 32.5 % — ABNORMAL LOW (ref 36.0–46.0)
Hemoglobin: 10.4 g/dL — ABNORMAL LOW (ref 12.0–15.0)
MCH: 27.7 pg (ref 26.0–34.0)
MCHC: 32 g/dL (ref 30.0–36.0)
MCV: 86.7 fL (ref 80.0–100.0)
Platelets: 312 10*3/uL (ref 150–400)
RBC: 3.75 MIL/uL — ABNORMAL LOW (ref 3.87–5.11)
RDW: 15.7 % — ABNORMAL HIGH (ref 11.5–15.5)
WBC: 10 10*3/uL (ref 4.0–10.5)
nRBC: 0 % (ref 0.0–0.2)

## 2022-04-04 LAB — BASIC METABOLIC PANEL
Anion gap: 9 (ref 5–15)
BUN: 8 mg/dL (ref 6–20)
CO2: 26 mmol/L (ref 22–32)
Calcium: 8.6 mg/dL — ABNORMAL LOW (ref 8.9–10.3)
Chloride: 101 mmol/L (ref 98–111)
Creatinine, Ser: 0.63 mg/dL (ref 0.44–1.00)
GFR, Estimated: >60 mL/min (ref >60)
Glucose, Bld: 109 mg/dL — ABNORMAL HIGH (ref 70–99)
Potassium: 3.6 mmol/L (ref 3.5–5.1)
Sodium: 136 mmol/L (ref 135–145)

## 2022-04-04 LAB — POC URINE PREG, ED: Preg Test, Ur: NEGATIVE

## 2022-04-04 MED ORDER — ONDANSETRON HCL 4 MG/2ML IJ SOLN
4.0000 mg | Freq: Once | INTRAMUSCULAR | Status: AC
  Administered 2022-04-04: 4 mg via INTRAVENOUS
  Filled 2022-04-04: qty 2

## 2022-04-04 MED ORDER — HYDROMORPHONE HCL 1 MG/ML IJ SOLN
1.0000 mg | Freq: Once | INTRAMUSCULAR | Status: AC
  Administered 2022-04-04: 1 mg via INTRAVENOUS
  Filled 2022-04-04: qty 1

## 2022-04-04 MED ORDER — SODIUM CHLORIDE 0.9 % IV BOLUS
1000.0000 mL | Freq: Once | INTRAVENOUS | Status: AC
  Administered 2022-04-04: 1000 mL via INTRAVENOUS

## 2022-04-04 MED ORDER — PROMETHAZINE HCL 25 MG/ML IJ SOLN
INTRAMUSCULAR | Status: AC
  Filled 2022-04-04: qty 1

## 2022-04-04 MED ORDER — MORPHINE SULFATE (PF) 4 MG/ML IV SOLN
6.0000 mg | Freq: Once | INTRAVENOUS | Status: AC
  Administered 2022-04-04: 6 mg via INTRAVENOUS
  Filled 2022-04-04: qty 2

## 2022-04-04 MED ORDER — SODIUM CHLORIDE 0.9 % IV SOLN
25.0000 mg | Freq: Four times a day (QID) | INTRAVENOUS | Status: DC | PRN
  Administered 2022-04-04: 25 mg via INTRAVENOUS
  Filled 2022-04-04: qty 1

## 2022-04-04 NOTE — ED Triage Notes (Signed)
Pt presents to ED with complaints of emesis since 0800 this am. Pt denies abdominal pain. Pt recently diagnosed with brain cancer and suppose to start chemo treatments tomorrow at [facility]. Pt went to [facility] ED and was waiting, had LOC unknown how long, states she became dizzy had LOC and fell, hit head on concrete outside.

## 2022-04-04 NOTE — ED Notes (Signed)
RN went to administer pts medications though her accessed port. Not able to flush line. RN will de-access port and attempt to obtain PIV.

## 2022-04-04 NOTE — ED Notes (Signed)
Patient transported to CT 

## 2022-04-04 NOTE — Discharge Instructions (Addendum)
You were seen in the ER for headaches and nausea, vomiting.  With the help of pain and nausea medications, your symptoms had improved or resolved.  As discussed, the CT scan finding is showing evidence of swelling/edema.  This is likely because of metastatic brain cancer.  Unfortunately, we do not have the ability to compare the CT scan from today with the CT scan you had 2 days ago.    Given that you have scheduled an MRI tomorrow, you are feeling better with the medications in the ER, and you are comfortable following up with outpatient doctors for additional plan, we feel comfortable discharging you.  Please take the CD we have provided off your CT scan of the brain with you for review by neuro-oncology and radiation oncology team.  Please discuss with your oncology team if you need to be started on steroids to keep the swelling down.  Return to the emergency room if your symptoms get worse.

## 2022-04-04 NOTE — ED Provider Notes (Signed)
[facility] EMERGENCY DEPARTMENT Provider Note   CSN: [id number] Arrival date & time: 04/04/22  1759     History  Chief Complaint  Patient presents with   Loss of Consciousness    April Moreno is a 36 y.o. female.  HPI     36 year old female comes in with chief complaint of headaches, dizziness, nausea and vomiting.  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  Patient indicates that she has had cancer now for over 2 years.  Brain metastases were found recently because of severe headaches and nausea, vomiting.  Patient had a CT scan done just 2 or 3 days ago.  She had her last chemo session about 1 week ago.  She is supposed to get MRI of the brain tomorrow.  MRI was emergently ordered because of CT scan she had recently.  Patient states that she has been having headaches for the last several weeks.  She also has associated nausea with vomiting.  Headaches are frontal, described as sharp, severe.  Today she had about 4 episodes of emesis.  Patient called 911, was taken to [facility] emergency room.  There were several patients in the waiting room, patient decided to come to any pain emergency room.  While in the ER at outside hospital, patient also started feeling dizzy and fell down.  She never lost consciousness.  She did hit her head in the process, but denies any headaches from the blunt trauma.  Patient's aunt is at the bedside.  States that she brought her to the ER, she just wants [place] to focus on symptom control.  Patient confirms that these headaches are not new.  She states that her cancer team actually has prescribed her nausea medication and pain medication, however she has not picked them up yet.  Home Medications Prior to Admission medications   Medication Sig Start Date End Date Taking? Authorizing Provider  amitriptyline (ELAVIL) 100 MG tablet Take 100 mg by mouth at bedtime.   Yes [provider]  ketorolac (TORADOL) 10  MG tablet Take 10 mg by mouth every 6 (six) hours as needed for moderate pain.   Yes [provider]  metoCLOPramide (REGLAN) 10 MG tablet Take 10 mg by mouth 2 (two) times daily.   Yes [provider]  morphine (MS CONTIN) 15 MG 12 hr tablet Take 15 mg by mouth every 12 (twelve) hours.   Yes [provider]  oxyCODONE-acetaminophen (PERCOCET/ROXICET) 5-325 MG tablet Take 1 tablet by mouth 4 (four) times daily as needed for moderate pain (breakthrough pain).   Yes [provider]  Butalbital-APAP-Caffeine (FIORICET) 50-300-40 MG CAPS Take 1-2 tablets by mouth daily as needed (migraines).    [provider]  omeprazole (PRILOSEC) 40 MG capsule Take 40 mg by mouth See admin instructions. Take 1 capsule daily while taking dexamethasone steroid    [provider]      Allergies    Amoxicillin and Latex    Review of Systems   Review of Systems  All other systems reviewed and are negative.   Physical Exam Updated Vital Signs BP (!) 143/108   Pulse (!) 111   Temp 98.4 F (36.9 C) (Oral)   Resp (!) 21   Ht 5' (1.524 m)   Wt 81.6 kg   SpO2 92%   BMI 35.15 kg/m  Physical Exam Vitals and nursing note reviewed.  Constitutional:      Appearance: She is well-developed.  HENT:  Head: Atraumatic.  Eyes:     Extraocular Movements: Extraocular movements intact.     Pupils: Pupils are equal, round, and reactive to light.  Cardiovascular:     Rate and Rhythm: Normal rate.  Pulmonary:     Effort: Pulmonary effort is normal.  Musculoskeletal:     Cervical back: Normal range of motion and neck supple.  Skin:    General: Skin is warm and dry.  Neurological:     Mental Status: She is alert and oriented to person, place, and time.     ED Results / Procedures / Treatments   Labs (all labs ordered are listed, but only abnormal results are displayed) Labs Reviewed  BASIC METABOLIC PANEL - Abnormal; Notable for the following components:       Result Value   Glucose, Bld 109 (*)    Calcium 8.6 (*)    All other components within normal limits  CBC - Abnormal; Notable for the following components:   RBC 3.75 (*)    Hemoglobin 10.4 (*)    HCT 32.5 (*)    RDW 15.7 (*)    All other components within normal limits  CBG MONITORING, ED - Abnormal; Notable for the following components:   Glucose-Capillary 109 (*)    All other components within normal limits  URINALYSIS, ROUTINE W REFLEX MICROSCOPIC  POC URINE PREG, ED    EKG EKG Interpretation  Date/Time:  Tuesday April 04 2022 18:51:51 EST Ventricular Rate:  105 PR Interval:    QRS Duration: 88 QT Interval:  352 QTC Calculation: 466 R Axis:   59 Text Interpretation: Normal sinus rhythm normal intervals No acute changes No old tracing to compare Confirmed by Varney Biles [phone] on 04/04/2022 11:14:14 PM  Radiology CT Head Wo Contrast  Result Date: 04/04/2022 CLINICAL DATA:  Brain metastasis, assess for treatment response. Recently diagnosed with brain cancer. History of dizziness, loss of consciousness, and fall. EXAM: CT HEAD WITHOUT CONTRAST TECHNIQUE: Contiguous axial images were obtained from the base of the skull through the vertex without intravenous contrast. RADIATION DOSE REDUCTION: This exam was performed according to the departmental dose-optimization program which includes automated exposure control, adjustment of the mA and/or kV according to patient size and/or use of iterative reconstruction technique. COMPARISON:  None Available. FINDINGS: Brain: No acute intracranial hemorrhage or midline shift. No hydrocephalus. Scattered vague hyperdense lesions with surrounding vasogenic edema is noted in the cerebral hemispheres bilaterally, basal ganglia on the right, the largest measuring 1.2 cm in the parietal lobe on the left. Evaluation for malignancy is limited due to lack of IV contrast. Vascular: No hyperdense vessel or unexpected calcification. Skull:  Normal. Negative for fracture or focal lesion. Sinuses/Orbits: No acute finding. Other: None. IMPRESSION: 1. No acute intracranial hemorrhage. 2. Scattered vague hyperdense lesions with surrounding vasogenic edema in the cerebellar hemispheres bilaterally and basal ganglia on the right, likely reflecting known brain metastasis. Comparison with older imaging studies is recommended for assessment of treatment. Electronically Signed   By: Brett Fairy M.D.   On: 04/04/2022 22:35    Procedures Procedures    Medications Ordered in ED Medications  promethazine (PHENERGAN) 25 mg in sodium chloride 0.9 % 50 mL IVPB (25 mg Intravenous New Bag/Given 04/04/22 2257)  ondansetron (ZOFRAN) injection 4 mg (4 mg Intravenous Given 04/04/22 1939)  HYDROmorphone (DILAUDID) injection 1 mg (1 mg Intravenous Given 04/04/22 2110)  ondansetron (ZOFRAN) injection 4 mg (4 mg Intravenous Given 04/04/22 2110)  sodium chloride 0.9 % bolus 1,000 mL (  0 mLs Intravenous Stopped 04/04/22 2205)  morphine (PF) 4 MG/ML injection 6 mg (6 mg Intravenous Given 04/04/22 2257)    ED Course/ Medical Decision Making/ A&P Clinical Course as of 04/04/22 2359  Tue Apr 04, 2022  2346 Patient stated that hydromorphone did not alleviate her headaches as much as she would have liked.  In the past, morphine and Phenergan has helped.  She received morphine and promethazine, and indicates now that she is feeling a lot better. [AN]  2346 CT Head Wo Contrast CT scan of the brain interpreted independently.  No evidence of brain bleed seen.  Given the cancer history, radiologist interpretation also reviewed.  There is evidence of vasogenic edema.  I discussed this finding with the patient.  I informed her that we do not have a CT scan in our system to compare her findings.  Patient states that her headache now is a lot better.  She has an MRI tomorrow, she is comfortable with discussing the CT from today with her radiation oncology team at the [facility]  system.  Patient's aunt at the bedside, also comfortable with this plan.  We discussed strict ER return precautions.  We will provide a copy of the CT scan to her so that she can share it with her oncology team tomorrow. [AN]    Clinical Course User Index [AN] Varney Biles, MD                           Medical Decision Making Problems Addressed: Metastatic cancer to brain [facility]): complicated acute illness or injury with systemic symptoms that poses a threat to life or bodily functions  Amount and/or Complexity of Data Reviewed Labs: ordered. Radiology: ordered. Decision-making details documented in ED Course.  Risk Prescription drug management.  36 year old patient with metastatic breast cancer, with metastases to the brain comes in with chief complaint of headache, nausea and vomiting.  Patient gets her care at the [facility] system.  Earlier today, she started having headaches, nausea and vomiting.  She called EMS and was taken to [facility].  While there, she had an episode of dizziness and near fainting.  Patient confirms to me that she did never passed out, that she fell down because of her dizziness, which she thinks is because of her nausea and vomiting.  Under the bedside.  She also wants [place] to focus on managing her symptoms better.  Patient is supposed to get MRI tomorrow, and they would want her to be feeling better prior to her MRI.  Patient has no focal neuro complaints and no acute neurodeficits.  Headaches do appear to be secondary to her likely metastatic disease.  Initially patient and the aunt informed me that they would want me to just focus on symptom management, and not order CT scan as she is supposed to get MRI tomorrow and she just had a CT scan on 11-5.  I gave patient hydromorphone and Zofran.  Upon reassessment, she indicated that she still having headache.  At that time I was able to convince them that it is best to get a CT scan of the brain to ensure there  is no hemorrhage.  CT scan has been completed.  There is no brain bleed.  Patient received morphine and promethazine for her headache, upon reassessment she states that her headache is a lot better and mostly resolved now.  Admission considered, however patient is supposed to get MRI tomorrow.  She also  is seeing her oncologist and is supposed to meet radiation oncology and neuro-oncology in outside system tomorrow.  I have reviewed care everywhere.  There is a clear note from LPN indicating that patient will be getting MRI at 11 AM tomorrow.  She is also supposed to see oncologist and that there is request for radiation oncology and neuro-oncology follow-up.  Given this finding, admission considered, however likely patient will benefit with the continued close outpatient follow-up.    Final Clinical Impression(s) / ED Diagnoses Final diagnoses:  Metastatic cancer to brain [facility])  Severe headache    Rx / DC Orders ED Discharge Orders     None         Varney Biles, MD 04/04/22 2359

## 2022-04-05 MED ORDER — HEPARIN SOD (PORK) LOCK FLUSH 100 UNIT/ML IV SOLN
INTRAVENOUS | Status: AC
  Filled 2022-04-05: qty 5

## 2022-05-29 DEATH — deceased
